# Patient Record
Sex: Female | Born: 1980 | Race: Asian | Hispanic: No | Marital: Single | Smoking: Never smoker
Health system: Southern US, Community
[De-identification: ages and names within clinical notes are randomized; demographics above are authoritative.]

## PROBLEM LIST (undated history)

## (undated) DIAGNOSIS — Z789 Other specified health status: Secondary | ICD-10-CM

## (undated) HISTORY — DX: Other specified health status: Z78.9

---
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
## 2012-01-15 ENCOUNTER — Emergency Department (HOSPITAL_COMMUNITY): Payer: No Typology Code available for payment source

## 2012-01-15 ENCOUNTER — Encounter (HOSPITAL_COMMUNITY): Payer: Self-pay | Admitting: Adult Health

## 2012-01-15 ENCOUNTER — Emergency Department (HOSPITAL_COMMUNITY)
Admission: EM | Admit: 2012-01-15 | Discharge: 2012-01-15 | Disposition: A | Discharge status: Home / Self Care | Payer: No Typology Code available for payment source | Attending: Emergency Medicine | Admitting: Emergency Medicine

## 2012-01-15 DIAGNOSIS — R079 Chest pain, unspecified: Secondary | ICD-10-CM | POA: Insufficient documentation

## 2012-01-15 DIAGNOSIS — M25519 Pain in unspecified shoulder: Secondary | ICD-10-CM | POA: Insufficient documentation

## 2012-01-15 DIAGNOSIS — M25539 Pain in unspecified wrist: Secondary | ICD-10-CM | POA: Insufficient documentation

## 2012-01-15 DIAGNOSIS — T1490XA Injury, unspecified, initial encounter: Secondary | ICD-10-CM | POA: Insufficient documentation

## 2012-01-15 DIAGNOSIS — R51 Headache: Secondary | ICD-10-CM | POA: Insufficient documentation

## 2012-01-15 DIAGNOSIS — R209 Unspecified disturbances of skin sensation: Secondary | ICD-10-CM | POA: Insufficient documentation

## 2012-01-15 DIAGNOSIS — M542 Cervicalgia: Secondary | ICD-10-CM | POA: Insufficient documentation

## 2012-01-15 DIAGNOSIS — M549 Dorsalgia, unspecified: Secondary | ICD-10-CM | POA: Insufficient documentation

## 2012-01-15 DIAGNOSIS — R220 Localized swelling, mass and lump, head: Secondary | ICD-10-CM | POA: Insufficient documentation

## 2012-01-15 MED ORDER — CYCLOBENZAPRINE HCL 5 MG PO TABS: 5.0000 mg | ORAL_TABLET | Freq: Three times a day (TID) | ORAL | Status: AC | PRN

## 2012-01-15 MED ORDER — FLUORESCEIN SODIUM 1 MG OP STRP
1.0000 | ORAL_STRIP | Freq: Once | OPHTHALMIC | Status: DC
  Filled 2012-01-15: qty 1

## 2012-01-15 MED ORDER — IBUPROFEN 600 MG PO TABS: 600.0000 mg | ORAL_TABLET | Freq: Four times a day (QID) | ORAL | Status: AC | PRN

## 2012-01-15 MED ORDER — PROPARACAINE HCL 0.5 % OP SOLN
1.0000 [drp] | Freq: Once | OPHTHALMIC | Status: DC
  Filled 2012-01-15: qty 15

## 2012-01-15 MED ORDER — IBUPROFEN 800 MG PO TABS
800.0000 mg | ORAL_TABLET | Freq: Once | ORAL | Status: AC
  Administered 2012-01-15: 800 mg via ORAL
  Filled 2012-01-15: qty 1
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
## 2012-01-15 NOTE — ED Notes (Signed)
Patient is resting comfortably. 

## 2012-01-15 NOTE — ED Notes (Signed)
Family at bedside. 

## 2012-01-15 NOTE — ED Provider Notes (Signed)
Medical screening examination/treatment/procedure(s) were performed by non-physician practitioner and as supervising physician I was immediately available for consultation/collaboration.  Ethelda Chick, MD 01/15/12 1326

## 2012-10-01 ENCOUNTER — Other Ambulatory Visit (HOSPITAL_COMMUNITY)
Admission: RE | Admit: 2012-10-01 | Discharge: 2012-10-01 | Disposition: A | Discharge status: Home / Self Care | Payer: PRIVATE HEALTH INSURANCE | Source: Ambulatory Visit | Attending: Family Medicine | Admitting: Family Medicine

## 2012-10-01 DIAGNOSIS — Z0000 Encounter for general adult medical examination without abnormal findings: Secondary | ICD-10-CM | POA: Insufficient documentation

## 2012-10-01 DIAGNOSIS — Z113 Encounter for screening for infections with a predominantly sexual mode of transmission: Secondary | ICD-10-CM | POA: Insufficient documentation

## 2015-05-25 ENCOUNTER — Other Ambulatory Visit (HOSPITAL_COMMUNITY)
Admission: RE | Admit: 2015-05-25 | Discharge: 2015-05-25 | Disposition: A | Discharge status: Home / Self Care | Payer: BLUE CROSS/BLUE SHIELD | Source: Ambulatory Visit | Attending: Nurse Practitioner | Admitting: Nurse Practitioner

## 2015-05-25 DIAGNOSIS — Z1151 Encounter for screening for human papillomavirus (HPV): Secondary | ICD-10-CM | POA: Insufficient documentation

## 2015-05-25 DIAGNOSIS — Z01419 Encounter for gynecological examination (general) (routine) without abnormal findings: Secondary | ICD-10-CM | POA: Diagnosis not present

## 2015-05-25 DIAGNOSIS — Z113 Encounter for screening for infections with a predominantly sexual mode of transmission: Secondary | ICD-10-CM | POA: Insufficient documentation

## 2015-06-27 ENCOUNTER — Other Ambulatory Visit: Payer: Self-pay | Admitting: Nurse Practitioner

## 2015-06-27 DIAGNOSIS — R1084 Generalized abdominal pain: Secondary | ICD-10-CM

## 2015-06-29 ENCOUNTER — Other Ambulatory Visit: Payer: PRIVATE HEALTH INSURANCE

## 2015-07-04 ENCOUNTER — Other Ambulatory Visit (HOSPITAL_BASED_OUTPATIENT_CLINIC_OR_DEPARTMENT_OTHER): Payer: Self-pay | Admitting: Family Medicine

## 2015-07-04 DIAGNOSIS — R109 Unspecified abdominal pain: Secondary | ICD-10-CM

## 2015-07-04 DIAGNOSIS — R1032 Left lower quadrant pain: Secondary | ICD-10-CM

## 2015-07-06 ENCOUNTER — Encounter (HOSPITAL_BASED_OUTPATIENT_CLINIC_OR_DEPARTMENT_OTHER): Payer: Self-pay

## 2015-07-06 ENCOUNTER — Ambulatory Visit (HOSPITAL_BASED_OUTPATIENT_CLINIC_OR_DEPARTMENT_OTHER)
Admission: RE | Admit: 2015-07-06 | Discharge: 2015-07-06 | Disposition: A | Discharge status: Home / Self Care | Payer: BLUE CROSS/BLUE SHIELD | Source: Ambulatory Visit | Attending: Family Medicine | Admitting: Family Medicine

## 2015-07-06 DIAGNOSIS — R109 Unspecified abdominal pain: Secondary | ICD-10-CM | POA: Diagnosis present

## 2015-07-06 DIAGNOSIS — N8320 Unspecified ovarian cysts: Secondary | ICD-10-CM | POA: Insufficient documentation

## 2015-07-06 DIAGNOSIS — N750 Cyst of Bartholin's gland: Secondary | ICD-10-CM | POA: Insufficient documentation

## 2015-07-06 DIAGNOSIS — R1032 Left lower quadrant pain: Secondary | ICD-10-CM

## 2015-07-06 MED ORDER — IOHEXOL 300 MG/ML  SOLN
100.0000 mL | Freq: Once | INTRAMUSCULAR | Status: AC | PRN
  Administered 2015-07-06: 100 mL via INTRAVENOUS

## 2016-01-18 ENCOUNTER — Encounter: Payer: Self-pay | Admitting: Family Medicine

## 2016-01-18 ENCOUNTER — Ambulatory Visit (INDEPENDENT_AMBULATORY_CARE_PROVIDER_SITE_OTHER): Payer: BLUE CROSS/BLUE SHIELD | Admitting: Family Medicine

## 2016-01-18 VITALS — BP 145/89 | HR 98 | Ht 62.0 in | Wt 127.0 lb

## 2016-01-18 DIAGNOSIS — N750 Cyst of Bartholin's gland: Secondary | ICD-10-CM | POA: Diagnosis not present

## 2016-01-18 MED ORDER — SULFAMETHOXAZOLE-TRIMETHOPRIM 800-160 MG PO TABS: 1.0000 | ORAL_TABLET | Freq: Two times a day (BID) | ORAL | Status: DC

## 2016-01-18 NOTE — Patient Instructions (Signed)
Bartholin Cyst or Abscess A Bartholin cyst is a fluid-filled sac that forms on a Bartholin gland. Bartholin glands are small glands that are located within the folds of skin (labia) along the sides of the lower opening of the vagina. These glands produce a fluid to moisten the outside of the vagina during sexual intercourse. A Bartholin cyst causes a bulge on the side of the vagina. A cyst that is not large or infected may not cause symptoms or problems. However, if the fluid within the cyst becomes infected, the cyst can turn into an abscess. An abscess may cause discomfort or pain. CAUSES A Bartholin cyst may develop when the duct of the gland becomes blocked. In many cases, the cause of this is not known. Various kinds of bacteria can cause the cyst to become infected and develop into an abscess. RISK FACTORS You may be at an increased risk of developing a Bartholin cyst or abscess if:  You are a woman of reproductive age.  You have a history of previous Bartholin cysts or abscesses.  You have diabetes.  You have a sexually transmitted disease (STD). SIGNS AND SYMPTOMS The severity of symptoms varies depending on the size of the cyst and whether it is infected. Symptoms may include:  A bulge or swelling near the lower opening of your vagina.  Discomfort or pain.  Redness.  Pain during sexual intercourse.  Pain when walking.  Fluid draining from the area. DIAGNOSIS Your health care provider may make a diagnosis based on your symptoms and a physical exam. He or she will look for swelling in your vaginal area. Blood tests may be done to check for infections. A sample of fluid from the cyst or abscess may also be taken to be tested in a lab. TREATMENT Small cysts that are not infected may not require any treatment. These often go away on their own. Yourhealth care provider will recommend hot baths and the use of warm compresses. These may also be part of the treatment for an abscess.  Treatment options for a large cyst or abscess may include:   Antibiotic medicine.  A surgical procedure to drain the abscess. One of the following procedures may be done:  Incision and drainage. An incision is made in the cyst or abscess so that the fluid drains out. A catheter may be placed inside the cyst so that it does not close and fill up with fluid again. The catheter will be removed after you have a follow-up visit with a specialist (gynecologist).  Marsupialization. The cyst or abscess is opened and kept open by stitching the edges of the skin to the walls of the cyst or abscess. This allows it to continue to drain and not fill up with fluid again. If you have cysts or abscesses that keep returning and have required incision and drainage multiple times, your health care provider may talk to you about surgery to remove the Bartholin gland. HOME CARE INSTRUCTIONS  Take medicines only as directed by your health care provider.  If you were prescribed an antibiotic medicine, finish it all even if you start to feel better.  Apply warm, wet compresses to the area or take warm, shallow baths that cover your pelvic region (sitz baths) several times a day or as directed by your health care provider.  Do not squeeze the cyst or apply heavy pressure to it.  Do not have sexual intercourse until the cyst has gone away.  If your cyst or abscess was   opened, a small piece of gauze or a drain may have been placed in the area to allow drainage. Do not remove the gauze or the drain until directed by your health care provider.  Wear feminine pads--not tampons--as needed for any drainage or bleeding.  Keep all follow-up visits as directed by your health care provider. This is important. PREVENTION Take these steps to help prevent a Bartholin cyst from returning:  Practice good hygiene.   Clean your vaginal area with mild soap and a soft cloth when you bathe.  Practice safe sex to prevent  STDs. SEEK MEDICAL CARE IF:  You have increased pain, swelling, or redness in the area of the cyst.  Puslike drainage is coming from the cyst.  You have a fever.   This information is not intended to replace advice given to you by your health care provider. Make sure you discuss any questions you have with your health care provider.   Document Released: 11/04/2005 Document Revised: 11/25/2014 Document Reviewed: 06/20/2014 Elsevier Interactive Patient Education 2016 [employer]  

## 2016-01-18 NOTE — Progress Notes (Addendum)
Patient seen for bartholin Cyst that start about 6 days ago and has become worse.  This is her third cyst in the past year. The last two were last summer.  She has had a Ward catheter placed the first time and had it packed the second time.  The packing fell out within three days, but didn't have reoccurence for until now.  I discussed excision with her, but she is unable to have the surgery done at this time.   Bartholin Cyst I&D  Enlarged abscess palpated in front of the hymenal ring around 5 o' clock.  Written informed consent was obtained.  Discussed complications and possible outcomes of procedure including recurrence of cyst, scarring leading to infecton, bleeding, dyspareunia, distortion of anatomy.  Patient was examined in the dorsal lithotomy position and mass was identified.  The area was prepped with Iodine and draped in a sterile manner. 1% Lidocaine (3 ml) was then used to infiltrate area on top of the cyst, behind the hymenal ring.  A 7 mm incision was made using a sterile scapel. Upon palpation of the mass, a moderate amount of bloody purulent drainage was expressed through the incision. A hemostat was used to break up loculations, which resulted in expression of more bloody purulent drainage.  5cm of iodoform packing was placed in the abscess.  Patient tolerated the procedure well, reported feeling " a lot better."  The packing should be removed within a few days - Bactrim DS bid x 7 days for treatment - Recommended Sitz baths bid and Motrin and Percocet was given  prn pain.   She was told to call to be examined if she experiences increasing swelling, pain, vaginal discharge, or fever.  - She was instructed to wear a peripad to absorb discharge, and to maintain pelvic rest.  - She will need an appointment in [facility] Clinic in 2 weeks.

## 2016-02-01 ENCOUNTER — Encounter: Payer: Self-pay | Admitting: Obstetrics & Gynecology

## 2016-02-01 ENCOUNTER — Encounter (HOSPITAL_COMMUNITY): Payer: Self-pay | Admitting: *Deleted

## 2016-02-01 ENCOUNTER — Ambulatory Visit (INDEPENDENT_AMBULATORY_CARE_PROVIDER_SITE_OTHER): Payer: BLUE CROSS/BLUE SHIELD | Admitting: Obstetrics & Gynecology

## 2016-02-01 VITALS — BP 135/84 | HR 76 | Ht 62.0 in | Wt 127.0 lb

## 2016-02-01 DIAGNOSIS — N750 Cyst of Bartholin's gland: Secondary | ICD-10-CM

## 2016-02-01 DIAGNOSIS — N83291 Other ovarian cyst, right side: Secondary | ICD-10-CM

## 2016-02-01 NOTE — Progress Notes (Signed)
Patient ID: Sherry Sosa, female   DOB: 05/30/1981, 35 y.o.   MRN: [id number] History:  35 y.o. G0P0000 here today for eval of Bartholins gland cyst. 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  ~1 week prev she had it lanced and packed but, the packing fell out that evening.  She declined a Word cath at that time because she'd had one prev and developed 'a UTI and there was a lot of blood in the toilet.'   She reports that she is in a lot of pain today and wants the cyst opened then removed in May. 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  She also reports having a cyst on her ovary that was noted on an MRI.  It was initially 2.5cm and [facility] it had decreased to 2.0cm.  She is [facility].  She denies abd or pelvic pain.  The following portions of the patient's history were reviewed and updated as appropriate: allergies, current medications, past family history, past medical history, past social history, past surgical history and problem list.  Review of Systems:  Pertinent items are noted in HPI.  Objective:  Physical Exam Blood pressure 135/84, pulse 76, height 5\' 2"  (1.575 m), weight 127 lb (57.607 kg), last menstrual period 01/28/2016. Gen: NAD Pelvic: There is non pointing Bartholin's swelling on her left side.  There is swelling with min induration and fluctuance. The actual gland is about 3x3 cm. Soft but tender. This is NOT at a point where it can be lanced safely and effectively.    Labs and Imaging 07/06/2015 CLINICAL DATA: Abdominal pain. Left flank pain. Suprapubic pain.  EXAM: CT ABDOMEN AND PELVIS WITH CONTRAST  TECHNIQUE: Multidetector CT imaging of the abdomen and pelvis was performed using the standard protocol following bolus administration of intravenous  contrast.  CONTRAST: 100mL OMNIPAQUE IOHEXOL 300 MG/ML SOLN  COMPARISON: Pelvic and abdominal ultrasounds dated 06/29/2015  FINDINGS: Lower chest: Negative.  Hepatobiliary: Normal.  Pancreas: Normal.  Spleen: Normal.  Adrenals/Urinary Tract: Normal.  Stomach/Bowel: Normal including the terminal ileum and appendix. The cecum lies in the midline of the pelvis just above the bladder.  Vascular/Lymphatic: Normal.  Reproductive: 2.5 cm cyst on the right ovary. Uterus and left ovary are normal. 15 mm Bartholin's gland cyst in the left labia.  Other: No free air or free fluid.  Musculoskeletal: Normal.  IMPRESSION: 2.5 cm cyst on the right ovary. Left Bartholin's gland cyst. Otherwise benign appearing abdomen and pelvis.  Assessment & Plan:  Right ov cyst- asymptomatic and probably benign.   [facility] to review sono from last month.  Bartholin's cyst-  Have discussed with pt in detail when these lesions can be lanced vs treated conservatively.  Since this is her 4th episode I rec cystectomy vs Marsupialization. Pt wants a Bartholin's gland removal but, is currently constrained by work.  She would like to schedule in May If lesion flares before procedure pt needs to f/u if it is on the weekend she may go to the [facility] if the pain is severe Warm compress to gland 4-5x/day   Patient desires surgical management with Bartholin's gland cystectomy.  The risks of surgery were discussed in detail with the patient including but not limited to: bleeding  which may require transfusion or reoperation; infection which may require prolonged hospitalization or re-hospitalization and antibiotic therapy; injury to bowel, bladder, ureters and major vessels or other surrounding organs; need for additional procedures including laparotomy; thromboembolic phenomenon, incisional problems and other postoperative or anesthesia complications.  Patient was told that the  likelihood that her condition and symptoms will be treated effectively with this surgical management was very high; the postoperative expectations were also discussed in detail. The patient also understands the alternative treatment options which were discussed in full. All questions were answered.  She was told that she will be contacted by our surgical scheduler regarding the time and date of her surgery; routine preoperative instructions of having nothing to eat or drink after midnight on the day prior to surgery and also coming to the hospital 1 1/2 hours prior to her time of surgery were also emphasized.  She was told she may be called for a preoperative appointment about a week prior to surgery and will be given further preoperative instructions at that visit. Printed patient education handouts about the procedure were given to the patient to review at home.

## 2016-02-01 NOTE — Patient Instructions (Signed)
Bartholin Cyst or Abscess A Bartholin cyst is a fluid-filled sac that forms on a Bartholin gland. Bartholin glands are small glands that are located within the folds of skin (labia) along the sides of the lower opening of the vagina. These glands produce a fluid to moisten the outside of the vagina during sexual intercourse. A Bartholin cyst causes a bulge on the side of the vagina. A cyst that is not large or infected may not cause symptoms or problems. However, if the fluid within the cyst becomes infected, the cyst can turn into an abscess. An abscess may cause discomfort or pain. CAUSES A Bartholin cyst may develop when the duct of the gland becomes blocked. In many cases, the cause of this is not known. Various kinds of bacteria can cause the cyst to become infected and develop into an abscess. RISK FACTORS You may be at an increased risk of developing a Bartholin cyst or abscess if:  You are a woman of reproductive age.  You have a history of previous Bartholin cysts or abscesses.  You have diabetes.  You have a sexually transmitted disease (STD). SIGNS AND SYMPTOMS The severity of symptoms varies depending on the size of the cyst and whether it is infected. Symptoms may include:  A bulge or swelling near the lower opening of your vagina.  Discomfort or pain.  Redness.  Pain during sexual intercourse.  Pain when walking.  Fluid draining from the area. DIAGNOSIS Your health care provider may make a diagnosis based on your symptoms and a physical exam. He or she will look for swelling in your vaginal area. Blood tests may be done to check for infections. A sample of fluid from the cyst or abscess may also be taken to be tested in a lab. TREATMENT Small cysts that are not infected may not require any treatment. These often go away on their own. Yourhealth care provider will recommend hot baths and the use of warm compresses. These may also be part of the treatment for an abscess.  Treatment options for a large cyst or abscess may include:   Antibiotic medicine.  A surgical procedure to drain the abscess. One of the following procedures may be done:  Incision and drainage. An incision is made in the cyst or abscess so that the fluid drains out. A catheter may be placed inside the cyst so that it does not close and fill up with fluid again. The catheter will be removed after you have a follow-up visit with a specialist (gynecologist).  Marsupialization. The cyst or abscess is opened and kept open by stitching the edges of the skin to the walls of the cyst or abscess. This allows it to continue to drain and not fill up with fluid again. If you have cysts or abscesses that keep returning and have required incision and drainage multiple times, your health care provider may talk to you about surgery to remove the Bartholin gland. HOME CARE INSTRUCTIONS  Take medicines only as directed by your health care provider.  If you were prescribed an antibiotic medicine, finish it all even if you start to feel better.  Apply warm, wet compresses to the area or take warm, shallow baths that cover your pelvic region (sitz baths) several times a day or as directed by your health care provider.  Do not squeeze the cyst or apply heavy pressure to it.  Do not have sexual intercourse until the cyst has gone away.  If your cyst or abscess was   opened, a small piece of gauze or a drain may have been placed in the area to allow drainage. Do not remove the gauze or the drain until directed by your health care provider.  Wear feminine pads--not tampons--as needed for any drainage or bleeding.  Keep all follow-up visits as directed by your health care provider. This is important. PREVENTION Take these steps to help prevent a Bartholin cyst from returning:  Practice good hygiene.   Clean your vaginal area with mild soap and a soft cloth when you bathe.  Practice safe sex to prevent  STDs. SEEK MEDICAL CARE IF:  You have increased pain, swelling, or redness in the area of the cyst.  Puslike drainage is coming from the cyst.  You have a fever.   This information is not intended to replace advice given to you by your health care provider. Make sure you discuss any questions you have with your health care provider.   Document Released: 11/04/2005 Document Revised: 11/25/2014 Document Reviewed: 06/20/2014 Elsevier Interactive Patient Education 2016 [employer]  

## 2016-02-11 ENCOUNTER — Other Ambulatory Visit: Payer: Self-pay | Admitting: Family Medicine

## 2016-02-19 ENCOUNTER — Ambulatory Visit (INDEPENDENT_AMBULATORY_CARE_PROVIDER_SITE_OTHER): Payer: BLUE CROSS/BLUE SHIELD | Admitting: Obstetrics & Gynecology

## 2016-02-19 ENCOUNTER — Encounter: Payer: Self-pay | Admitting: Obstetrics & Gynecology

## 2016-02-19 VITALS — BP 128/79 | HR 85 | Ht 62.0 in | Wt 128.0 lb

## 2016-02-19 DIAGNOSIS — N751 Abscess of Bartholin's gland: Secondary | ICD-10-CM | POA: Diagnosis not present

## 2016-02-19 MED ORDER — SULFAMETHOXAZOLE-TRIMETHOPRIM 800-160 MG PO TABS: 1.0000 | ORAL_TABLET | Freq: Two times a day (BID) | ORAL | Status: DC

## 2016-02-19 MED ORDER — TRAMADOL HCL 50 MG PO TABS: 50.0000 mg | ORAL_TABLET | Freq: Four times a day (QID) | ORAL | Status: DC | PRN

## 2016-02-19 NOTE — Progress Notes (Signed)
Patient ID: Sherry FraiseYen Thi Mardene SayerHong Sosa, female   DOB: 1981/01/16, 35 y.o.   MRN: [id number] Bartholin Cyst I&D and Word Catheter Placement Enlarged abscess palpated in front of the hymenal ring around 5 o' clock.  Written informed consent was obtained.  Discussed complications and possible outcomes of procedure including recurrence of cyst, scarring leading to infection, bleeding, dyspareunia, distortion of anatomy.  Patient was examined in the dorsal lithotomy position and mass was identified.  The area was prepped with Iodine and draped in a sterile manner. 1% Lidocaine (3 ml) was then used to infiltrate area on top of the cyst.  A 5 mm incision was made using a sterile scapel. Upon palpation of the mass, a moderate amount of bloody purulent drainage was expressed through the incision. A Word catheter was placed. 1.5 ml of sterile water was used to inflate the catheter balloon.  The end of the catheter was tucked into the vagina.  Patient tolerated the procedure well, reported feeling " a lot better." - Bactrim DS bid x 7 days for treatment - Recommended Sitz baths bid and Naproxen was given prn pain.   She was told to call to be examined if she experiences increasing swelling, pain, vaginal discharge, or fever.  - She was instructed to wear a peripad to absorb discharge, and to maintain pelvic rest while the Word catheter is in place.  -The catheter will be left in place for at least two to promote formation of an epithelialized tract.  Pt wishes to have gland excised as this is a recurrent problem.  She is scheduled for excision in May but now wants it ASAP. Will move case to April 11.  Divya Munshi L. Harraway-Smith, M.D., Evern CoreFACOG

## 2016-02-19 NOTE — Patient Instructions (Signed)
Bartholin Cyst or Abscess A Bartholin cyst is a fluid-filled sac that forms on a Bartholin gland. Bartholin glands are small glands that are located within the folds of skin (labia) along the sides of the lower opening of the vagina. These glands produce a fluid to moisten the outside of the vagina during sexual intercourse. A Bartholin cyst causes a bulge on the side of the vagina. A cyst that is not large or infected may not cause symptoms or problems. However, if the fluid within the cyst becomes infected, the cyst can turn into an abscess. An abscess may cause discomfort or pain. CAUSES A Bartholin cyst may develop when the duct of the gland becomes blocked. In many cases, the cause of this is not known. Various kinds of bacteria can cause the cyst to become infected and develop into an abscess. RISK FACTORS You may be at an increased risk of developing a Bartholin cyst or abscess if:  You are a woman of reproductive age.  You have a history of previous Bartholin cysts or abscesses.  You have diabetes.  You have a sexually transmitted disease (STD). SIGNS AND SYMPTOMS The severity of symptoms varies depending on the size of the cyst and whether it is infected. Symptoms may include:  A bulge or swelling near the lower opening of your vagina.  Discomfort or pain.  Redness.  Pain during sexual intercourse.  Pain when walking.  Fluid draining from the area. DIAGNOSIS Your health care provider may make a diagnosis based on your symptoms and a physical exam. He or she will look for swelling in your vaginal area. Blood tests may be done to check for infections. A sample of fluid from the cyst or abscess may also be taken to be tested in a lab. TREATMENT Small cysts that are not infected may not require any treatment. These often go away on their own. Yourhealth care provider will recommend hot baths and the use of warm compresses. These may also be part of the treatment for an abscess.  Treatment options for a large cyst or abscess may include:   Antibiotic medicine.  A surgical procedure to drain the abscess. One of the following procedures may be done:  Incision and drainage. An incision is made in the cyst or abscess so that the fluid drains out. A catheter may be placed inside the cyst so that it does not close and fill up with fluid again. The catheter will be removed after you have a follow-up visit with a specialist (gynecologist).  Marsupialization. The cyst or abscess is opened and kept open by stitching the edges of the skin to the walls of the cyst or abscess. This allows it to continue to drain and not fill up with fluid again. If you have cysts or abscesses that keep returning and have required incision and drainage multiple times, your health care provider may talk to you about surgery to remove the Bartholin gland. HOME CARE INSTRUCTIONS  Take medicines only as directed by your health care provider.  If you were prescribed an antibiotic medicine, finish it all even if you start to feel better.  Apply warm, wet compresses to the area or take warm, shallow baths that cover your pelvic region (sitz baths) several times a day or as directed by your health care provider.  Do not squeeze the cyst or apply heavy pressure to it.  Do not have sexual intercourse until the cyst has gone away.  If your cyst or abscess was   opened, a small piece of gauze or a drain may have been placed in the area to allow drainage. Do not remove the gauze or the drain until directed by your health care provider.  Wear feminine pads--not tampons--as needed for any drainage or bleeding.  Keep all follow-up visits as directed by your health care provider. This is important. PREVENTION Take these steps to help prevent a Bartholin cyst from returning:  Practice good hygiene.   Clean your vaginal area with mild soap and a soft cloth when you bathe.  Practice safe sex to prevent  STDs. SEEK MEDICAL CARE IF:  You have increased pain, swelling, or redness in the area of the cyst.  Puslike drainage is coming from the cyst.  You have a fever.   This information is not intended to replace advice given to you by your health care provider. Make sure you discuss any questions you have with your health care provider.   Document Released: 11/04/2005 Document Revised: 11/25/2014 Document Reviewed: 06/20/2014 Elsevier Interactive Patient Education 2016 [employer]  

## 2016-02-23 ENCOUNTER — Encounter (HOSPITAL_COMMUNITY): Payer: Self-pay

## 2016-02-23 ENCOUNTER — Encounter (HOSPITAL_COMMUNITY)
Admission: RE | Admit: 2016-02-23 | Discharge: 2016-02-23 | Disposition: A | Discharge status: Home / Self Care | Payer: BLUE CROSS/BLUE SHIELD | Source: Ambulatory Visit | Attending: Obstetrics & Gynecology | Admitting: Obstetrics & Gynecology

## 2016-02-23 DIAGNOSIS — Z01812 Encounter for preprocedural laboratory examination: Secondary | ICD-10-CM | POA: Diagnosis not present

## 2016-02-23 DIAGNOSIS — N751 Abscess of Bartholin's gland: Secondary | ICD-10-CM | POA: Insufficient documentation

## 2016-02-23 LAB — CBC
HCT: 38.5 % (ref 36.0–46.0)
Hemoglobin: 13 g/dL (ref 12.0–15.0)
MCH: 30.9 pg (ref 26.0–34.0)
MCHC: 33.8 g/dL (ref 30.0–36.0)
MCV: 91.4 fL (ref 78.0–100.0)
PLATELETS: 360 10*3/uL (ref 150–400)
RBC: 4.21 MIL/uL (ref 3.87–5.11)
RDW: 12.5 % (ref 11.5–15.5)
WBC: 7.6 10*3/uL (ref 4.0–10.5)

## 2016-02-23 NOTE — Patient Instructions (Signed)
   Your procedure is scheduled on: April 11 (TUESDAY)  Enter through the Main Entrance of [facility] at: 1PM  Pick up the phone at the desk and dial [phone] and inform us of your arrival.  Please call this number if you have any problems the morning of surgery: [phone]  Remember: Do not eat food after midnight: April 10 (MONDAY) Do not drink clear liquids after: 10AM DAY OF SURGERY  Take these medicines the morning of surgery with a SIP OF WATER:  Do not wear jewelry, make-up,  No metal in your hair or on your body. Do not wear lotions, powders, perfumes.  You may wear deodorant.  Do not bring valuables to the hospital. Contacts, dentures or bridgework may not be worn into surgery.    Patients discharged on the day of surgery will not be allowed to drive home.

## 2016-02-27 ENCOUNTER — Ambulatory Visit (HOSPITAL_COMMUNITY): Payer: BLUE CROSS/BLUE SHIELD | Admitting: Anesthesiology

## 2016-02-27 ENCOUNTER — Observation Stay (HOSPITAL_COMMUNITY)
Admission: RE | Admit: 2016-02-27 | Discharge: 2016-02-28 | Disposition: A | Discharge status: Home / Self Care | Payer: BLUE CROSS/BLUE SHIELD | Source: Ambulatory Visit | Attending: Family Medicine | Admitting: Family Medicine

## 2016-02-27 ENCOUNTER — Encounter (HOSPITAL_COMMUNITY)
Admission: RE | Disposition: A | Discharge status: Home / Self Care | Payer: Self-pay | Source: Ambulatory Visit | Attending: Family Medicine

## 2016-02-27 ENCOUNTER — Encounter (HOSPITAL_COMMUNITY): Payer: Self-pay | Admitting: *Deleted

## 2016-02-27 DIAGNOSIS — Z886 Allergy status to analgesic agent status: Secondary | ICD-10-CM | POA: Diagnosis not present

## 2016-02-27 DIAGNOSIS — Z91040 Latex allergy status: Secondary | ICD-10-CM | POA: Diagnosis not present

## 2016-02-27 DIAGNOSIS — N751 Abscess of Bartholin's gland: Secondary | ICD-10-CM

## 2016-02-27 DIAGNOSIS — Z91013 Allergy to seafood: Secondary | ICD-10-CM | POA: Diagnosis not present

## 2016-02-27 DIAGNOSIS — Z9889 Other specified postprocedural states: Secondary | ICD-10-CM

## 2016-02-27 DIAGNOSIS — N750 Cyst of Bartholin's gland: Secondary | ICD-10-CM | POA: Diagnosis not present

## 2016-02-27 HISTORY — PX: BARTHOLIN CYST MARSUPIALIZATION: SHX5383

## 2016-02-27 LAB — BASIC METABOLIC PANEL
ANION GAP: 6 (ref 5–15)
BUN: 8 mg/dL (ref 6–20)
CHLORIDE: 98 mmol/L — AB (ref 101–111)
CO2: 26 mmol/L (ref 22–32)
Calcium: 8.5 mg/dL — ABNORMAL LOW (ref 8.9–10.3)
Creatinine, Ser: 0.69 mg/dL (ref 0.44–1.00)
GFR calc Af Amer: >60 mL/min (ref >60)
GFR calc non Af Amer: >60 mL/min (ref >60)
Glucose, Bld: 133 mg/dL — ABNORMAL HIGH (ref 65–99)
POTASSIUM: 3.8 mmol/L (ref 3.5–5.1)
SODIUM: 130 mmol/L — AB (ref 135–145)

## 2016-02-27 LAB — PREGNANCY, URINE: Preg Test, Ur: NEGATIVE

## 2016-02-27 SURGERY — MARSUPIALIZATION, CYST, BARTHOLIN'S GLAND
Anesthesia: Monitor Anesthesia Care | Site: Vagina

## 2016-02-27 MED ORDER — SULFAMETHOXAZOLE-TRIMETHOPRIM 800-160 MG PO TABS: 1.0000 | ORAL_TABLET | Freq: Two times a day (BID) | ORAL | Status: DC

## 2016-02-27 MED ORDER — TRAMADOL HCL 50 MG PO TABS
50.0000 mg | ORAL_TABLET | Freq: Four times a day (QID) | ORAL | Status: DC
Start: 2016-02-27 — End: 2016-02-28
  Administered 2016-02-27 – 2016-02-28 (×2): 50 mg via ORAL
  Filled 2016-02-27 (×2): qty 1

## 2016-02-27 MED ORDER — OXYCODONE HCL 5 MG PO TABS
5.0000 mg | ORAL_TABLET | Freq: Once | ORAL | Status: AC | PRN
  Administered 2016-02-27: 5 mg via ORAL

## 2016-02-27 MED ORDER — FENTANYL CITRATE (PF) 100 MCG/2ML IJ SOLN
25.0000 ug | INTRAMUSCULAR | Status: DC | PRN
  Administered 2016-02-27 (×2): 50 ug via INTRAVENOUS

## 2016-02-27 MED ORDER — OXYCODONE-ACETAMINOPHEN 5-325 MG PO TABS
1.0000 | ORAL_TABLET | ORAL | Status: DC | PRN
  Administered 2016-02-27: 2 via ORAL
  Filled 2016-02-27: qty 2

## 2016-02-27 MED ORDER — SCOPOLAMINE 1 MG/3DAYS TD PT72
1.0000 | MEDICATED_PATCH | Freq: Once | TRANSDERMAL | Status: DC
  Administered 2016-02-27: 1.5 mg via TRANSDERMAL

## 2016-02-27 MED ORDER — FENTANYL CITRATE (PF) 250 MCG/5ML IJ SOLN
INTRAMUSCULAR | Status: DC | PRN
  Administered 2016-02-27: 100 ug via INTRAVENOUS

## 2016-02-27 MED ORDER — LACTATED RINGERS IV SOLN: INTRAVENOUS | Status: DC

## 2016-02-27 MED ORDER — LIDOCAINE HCL (CARDIAC) 20 MG/ML IV SOLN
INTRAVENOUS | Status: DC | PRN
  Administered 2016-02-27: 80 mg via INTRAVENOUS

## 2016-02-27 MED ORDER — FENTANYL CITRATE (PF) 100 MCG/2ML IJ SOLN
INTRAMUSCULAR | Status: AC
  Filled 2016-02-27: qty 2

## 2016-02-27 MED ORDER — LACTATED RINGERS IV BOLUS (SEPSIS)
500.0000 mL | Freq: Once | INTRAVENOUS | Status: AC
  Administered 2016-02-27: 500 mL via INTRAVENOUS

## 2016-02-27 MED ORDER — SODIUM CHLORIDE 0.9% FLUSH
INTRAVENOUS | Status: AC
  Filled 2016-02-27: qty 3

## 2016-02-27 MED ORDER — LACTATED RINGERS IV SOLN
INTRAVENOUS | Status: DC
  Administered 2016-02-27 (×2): via INTRAVENOUS

## 2016-02-27 MED ORDER — ONDANSETRON HCL 4 MG/2ML IJ SOLN: 4.0000 mg | Freq: Once | INTRAMUSCULAR | Status: DC | PRN

## 2016-02-27 MED ORDER — ONDANSETRON HCL 4 MG/2ML IJ SOLN
INTRAMUSCULAR | Status: AC
  Filled 2016-02-27: qty 2

## 2016-02-27 MED ORDER — SCOPOLAMINE 1 MG/3DAYS TD PT72
MEDICATED_PATCH | TRANSDERMAL | Status: AC
  Administered 2016-02-27: 1.5 mg via TRANSDERMAL
  Filled 2016-02-27: qty 1

## 2016-02-27 MED ORDER — MIDAZOLAM HCL 2 MG/2ML IJ SOLN
INTRAMUSCULAR | Status: AC
  Filled 2016-02-27: qty 2

## 2016-02-27 MED ORDER — FENTANYL CITRATE (PF) 250 MCG/5ML IJ SOLN
INTRAMUSCULAR | Status: AC
  Filled 2016-02-27: qty 5

## 2016-02-27 MED ORDER — DEXAMETHASONE SODIUM PHOSPHATE 4 MG/ML IJ SOLN
INTRAMUSCULAR | Status: DC | PRN
  Administered 2016-02-27: 4 mg via INTRAVENOUS

## 2016-02-27 MED ORDER — LIDOCAINE HCL (CARDIAC) 20 MG/ML IV SOLN
INTRAVENOUS | Status: AC
  Filled 2016-02-27: qty 5

## 2016-02-27 MED ORDER — BUPIVACAINE HCL (PF) 0.5 % IJ SOLN
INTRAMUSCULAR | Status: AC
  Filled 2016-02-27: qty 30

## 2016-02-27 MED ORDER — OXYCODONE-ACETAMINOPHEN 5-325 MG PO TABS: 1.0000 | ORAL_TABLET | Freq: Four times a day (QID) | ORAL | Status: DC | PRN

## 2016-02-27 MED ORDER — ZOLPIDEM TARTRATE 5 MG PO TABS
5.0000 mg | ORAL_TABLET | Freq: Every evening | ORAL | Status: DC | PRN
  Administered 2016-02-27: 5 mg via ORAL
  Filled 2016-02-27: qty 1

## 2016-02-27 MED ORDER — MIDAZOLAM HCL 2 MG/2ML IJ SOLN
INTRAMUSCULAR | Status: DC | PRN
  Administered 2016-02-27: 2 mg via INTRAVENOUS

## 2016-02-27 MED ORDER — PROPOFOL 10 MG/ML IV BOLUS
INTRAVENOUS | Status: DC | PRN
  Administered 2016-02-27: 50 mg via INTRAVENOUS
  Administered 2016-02-27: 150 mg via INTRAVENOUS

## 2016-02-27 MED ORDER — BUPIVACAINE HCL (PF) 0.5 % IJ SOLN
INTRAMUSCULAR | Status: DC | PRN
  Administered 2016-02-27: 30 mL

## 2016-02-27 MED ORDER — PROPOFOL 10 MG/ML IV BOLUS
INTRAVENOUS | Status: AC
  Filled 2016-02-27: qty 20

## 2016-02-27 MED ORDER — ONDANSETRON HCL 4 MG/2ML IJ SOLN
INTRAMUSCULAR | Status: DC | PRN
  Administered 2016-02-27: 4 mg via INTRAVENOUS

## 2016-02-27 MED ORDER — DEXAMETHASONE SODIUM PHOSPHATE 10 MG/ML IJ SOLN
INTRAMUSCULAR | Status: AC
  Filled 2016-02-27: qty 1

## 2016-02-27 MED ORDER — OXYCODONE HCL 5 MG PO TABS
ORAL_TABLET | ORAL | Status: AC
  Filled 2016-02-27: qty 1

## 2016-02-27 MED ORDER — OXYCODONE HCL 5 MG/5ML PO SOLN: 5.0000 mg | Freq: Once | ORAL | Status: AC | PRN

## 2016-02-27 SURGICAL SUPPLY — 27 items
BLADE SURG 15 STRL LF C SS BP (BLADE) ×1 IMPLANT
BLADE SURG 15 STRL SS (BLADE) ×1
CLOTH BEACON ORANGE TIMEOUT ST (SAFETY) ×2 IMPLANT
CONTAINER PREFILL 10% NBF 60ML (FORM) IMPLANT
COUNTER NEEDLE 1200 MAGNETIC (NEEDLE) IMPLANT
ELECT NEEDLE TIP 2.8 STRL (NEEDLE) ×2 IMPLANT
ELECT REM PT RETURN 9FT ADLT (ELECTROSURGICAL) ×2
ELECTRODE REM PT RTRN 9FT ADLT (ELECTROSURGICAL) ×1 IMPLANT
GLOVE BIOGEL PI IND STRL 7.0 (GLOVE) ×2 IMPLANT
GLOVE BIOGEL PI INDICATOR 7.0 (GLOVE) ×2
GLOVE ECLIPSE 7.0 STRL STRAW (GLOVE) ×4 IMPLANT
GOWN STRL REUS W/TWL LRG LVL3 (GOWN DISPOSABLE) ×4 IMPLANT
GOWN STRL REUS W/TWL XL LVL3 (GOWN DISPOSABLE) ×2 IMPLANT
HEMOSTAT SURGICEL 4X8 (HEMOSTASIS) ×2 IMPLANT
NEEDLE HYPO 22GX1.5 SAFETY (NEEDLE) IMPLANT
PACK VAGINAL MINOR WOMEN LF (CUSTOM PROCEDURE TRAY) ×2 IMPLANT
PAD PREP 24X48 CUFFED NSTRL (MISCELLANEOUS) ×2 IMPLANT
PENCIL BUTTON HOLSTER BLD 10FT (ELECTRODE) ×2 IMPLANT
SUT VIC AB 2-0 CT1 27 (SUTURE) ×1
SUT VIC AB 2-0 CT1 TAPERPNT 27 (SUTURE) ×1 IMPLANT
SUT VIC AB 3-0 SH 27 (SUTURE) ×1
SUT VIC AB 3-0 SH 27X BRD (SUTURE) ×1 IMPLANT
SYR BULB 3OZ (MISCELLANEOUS) ×4 IMPLANT
TOWEL OR 17X24 6PK STRL BLUE (TOWEL DISPOSABLE) ×4 IMPLANT
TUBING NON-CON 1/4 X 20 CONN (TUBING) ×2 IMPLANT
WATER STERILE IRR 1000ML POUR (IV SOLUTION) ×2 IMPLANT
YANKAUER SUCT BULB TIP NO VENT (SUCTIONS) ×2 IMPLANT

## 2016-02-27 NOTE — Progress Notes (Signed)
Dr. Adrian BlackwaterStinson called. Patient still not able to void after drinking fluids, walking floors and attempting to void in restroom. He is aware patient feels like she has swelling to perineum.

## 2016-02-27 NOTE — Op Note (Addendum)
02/27/2016  4:39 PM  PATIENT:  Sherry Sosa  35 y.o. female  PRE-OPERATIVE DIAGNOSIS:   left bartholin gland cyst  POST-OPERATIVE DIAGNOSIS:   left bartholin gland cyst  PROCEDURE:  Procedure(s): BARTHOLIN CYST RESECTION (N/A)  SURGEON:  Surgeon(s) and Role:    * Willodean Rosenthalarolyn Harraway-Smith, MD - Primary  ANESTHESIA:   general  EBL:  Total I/O In: 1500 [I.V.:1500] Out: 85 [Urine:10; Blood:75]  BLOOD ADMINISTERED:none  DRAINS: none   LOCAL MEDICATIONS USED:  MARCAINE     SPECIMEN:  Source of Specimen:  cyst wall  DISPOSITION OF SPECIMEN:  PATHOLOGY  COUNTS:  YES  TOURNIQUET:  * No tourniquets in log *  DICTATION: .Note written in EPIC  PLAN OF CARE: Discharge to home after PACU  PATIENT DISPOSITION:  PACU - hemodynamically stable.   Delay start of Pharmacological VTE agent (>24hrs) due to surgical blood loss or risk of bleeding: not applicable  Enlarged Bartholin's gland palpated in front of the hymenal ring around 5 or 7 o' clock on left side. Written informed consent was obtained. the patient insisted on a Bartholin's gland resection vs a marsupialization.  Preoperative consent was obtained Patient was placed in the dorsal lithotomy position and prepped and draped in the usual sterile fashion. A Word catheter was in place. This was removed and the site was irrigated with normal saline. The fluid was clear.  0.5% Marcaine was then used to infiltrate area on top of the cyst. An incision was made over the cyst wall with a scalpel and using Alis clamps the cyst wall was dissected off of the vaginal mucosa. The cyst was dissected off of the vaginal mucosa using sharp and blunt dissection (using a sponge to gently separate the cyst wall from the vagina). The collapsed cyst was was dissected out completely. The bed of the cyst space was closed with 3-0 vicryl in a running and locked fashion. Excellent hemostasis was noted. The vaginal mucosa was reapproximated using 3-0  vicryl. The patient tolerated the procedure well. Excellent hemostasis was noted. There were no immediate complications  - Bactrim DS  bid x 7 days for treatment - Sitz baths bid. Percocet was given prn pain. She was told to call to be examined if she experiences increasing swelling, pain, vaginal discharge, or fever. - She was instructed to wear a peripad.  - [facility]  Taquanna Borras L. Harraway-Smith, M.D., Evern CoreFACOG

## 2016-02-27 NOTE — Brief Op Note (Signed)
02/27/2016  4:39 PM  PATIENT:  Sherry Sosa  35 y.o. female  PRE-OPERATIVE DIAGNOSIS:   left bartholin gland cyst  POST-OPERATIVE DIAGNOSIS:   left bartholin gland cyst  PROCEDURE:  Procedure(s): BARTHOLIN CYST RESECTION (N/A)  SURGEON:  Surgeon(s) and Role:    * Willodean Rosenthalarolyn Harraway-Smith, MD - Primary  ANESTHESIA:   general  EBL:  Total I/O In: 1500 [I.V.:1500] Out: 85 [Urine:10; Blood:75]  BLOOD ADMINISTERED:none  DRAINS: none   LOCAL MEDICATIONS USED:  MARCAINE     SPECIMEN:  Source of Specimen:  cyst wall  DISPOSITION OF SPECIMEN:  PATHOLOGY  COUNTS:  YES  TOURNIQUET:  * No tourniquets in log *  DICTATION: .Note written in EPIC  PLAN OF CARE: Discharge to home after PACU  PATIENT DISPOSITION:  PACU - hemodynamically stable.   Delay start of Pharmacological VTE agent (>24hrs) due to surgical blood loss or risk of bleeding: not applicable  Complications: none  Sherry Sosa L. Harraway-Smith, M.D., [facility] CoreFACOG

## 2016-02-27 NOTE — Anesthesia Postprocedure Evaluation (Signed)
Anesthesia Post Note  Patient: [name]  Procedure(s) Performed: Procedure(s) (LRB): BARTHOLIN CYST RESECTION (N/A)  Patient location during evaluation: PACU Anesthesia Type: General Level of consciousness: awake Pain management: pain level controlled Vital Signs Assessment: post-procedure vital signs reviewed and stable Respiratory status: spontaneous breathing Cardiovascular status: stable Postop Assessment: no signs of nausea or vomiting Anesthetic complications: no    Last Vitals:  Filed Vitals:   02/27/16 1700 02/27/16 1715  BP: 149/98 139/86  Pulse: 56 69  Temp:    Resp: 20 19    Last Pain:  Filed Vitals:   02/27/16 1725  PainSc: 5                  [name],JOHN Violett Hobbs

## 2016-02-27 NOTE — Anesthesia Preprocedure Evaluation (Addendum)
Anesthesia Evaluation  Patient identified by MRN, date of birth, ID band Patient awake    Reviewed: Allergy & Precautions, NPO status , Patient's Chart, lab work & pertinent test results  Airway Mallampati: II  TM Distance: >3 FB Neck ROM: Full    Dental  (+) Teeth Intact, Dental Advisory Given   Pulmonary    breath sounds clear to auscultation       Cardiovascular  Rhythm:Regular Rate:Normal     Neuro/Psych    GI/Hepatic   Endo/Other    Renal/GU      Musculoskeletal   Abdominal   Peds  Hematology   Anesthesia Other Findings   Reproductive/Obstetrics                            Anesthesia Physical Anesthesia Plan  ASA: I  Anesthesia Plan: MAC   Post-op Pain Management:    Induction: Intravenous  Airway Management Planned: LMA  Additional Equipment:   Intra-op Plan:   Post-operative Plan:   Informed Consent: I have reviewed the patients History and Physical, chart, labs and discussed the procedure including the risks, benefits and alternatives for the proposed anesthesia with the patient or authorized representative who has indicated his/her understanding and acceptance.   Dental advisory given  Plan Discussed with: CRNA and Anesthesiologist  Anesthesia Plan Comments:         Anesthesia Quick Evaluation

## 2016-02-27 NOTE — H&P (Signed)
Preoperative History and Physical  Sherry ChandlerYen Thi Hong Sosa RollsLe is a 35 y.o. G0P0000 here for surgical management of Bartholin's gland abscess.   Proposed surgery: Bartholin's gland resection possible Bartholin's gland marsupialization  No past medical history on file. No past surgical history on file. OB History    Gravida Para Term Preterm AB TAB SAB Ectopic Multiple Living   0 0 0 0 0 0 0 0 0 0      Patient denies any cervical dysplasia or STIs. Prescriptions prior to admission  Medication Sig Dispense Refill Last Dose  . acetaminophen (TYLENOL) 325 MG tablet Take 325 mg by mouth every 6 (six) hours as needed for moderate pain.    Taking  . cetirizine (ZYRTEC) 10 MG tablet Take 10 mg by mouth daily.     . cycloSPORINE (RESTASIS) 0.05 % ophthalmic emulsion Place 1 drop into both eyes 2 (two) times daily.     . norethindrone-ethinyl estradiol (JUNEL FE,GILDESS FE,LOESTRIN FE) 1-20 MG-MCG tablet Take 1 tablet by mouth daily.     . Olopatadine HCl (PAZEO) 0.7 % SOLN Place 1 drop into both eyes daily.     . ranitidine (ZANTAC) 150 MG tablet Take 150 mg by mouth daily as needed for heartburn.     . sulfamethoxazole-trimethoprim (BACTRIM DS,SEPTRA DS) 800-160 MG tablet Take 1 tablet by mouth 2 (two) times daily. 14 tablet 0   . traMADol (ULTRAM) 50 MG tablet Take 1 tablet (50 mg total) by mouth every 6 (six) hours as needed. (Patient taking differently: Take 50 mg by mouth every 6 (six) hours as needed for moderate pain. ) 30 tablet 0   . sulfamethoxazole-trimethoprim (BACTRIM DS,SEPTRA DS) 800-160 MG tablet Take 1 tablet by mouth 2 (two) times daily. (Patient not taking: Reported on 02/19/2016) 14 tablet 0 Completed Course at Unknown time    Allergies  Allergen Reactions  . Tuna [Fish Allergy] Anaphylaxis  . Advil [Ibuprofen] Swelling  . Latex Hives   Social History:   reports that she has never smoked. She does not have any smokeless tobacco history on file. She reports that she does not drink alcohol  or use illicit drugs. Family History  Problem Relation Age of Onset  . Cancer Neg Hx   . Hypertension Neg Hx   . Stroke Neg Hx     Review of Systems: Noncontributory  PHYSICAL EXAM: Blood pressure 128/77, pulse 70, temperature 98.2 F (36.8 C), resp. rate 20, last menstrual period 01/28/2016, SpO2 100 %. General appearance - alert, well appearing, and in no distress Chest - clear to auscultation, no wheezes, rales or rhonchi, symmetric air entry Heart - normal rate and regular rhythm Abdomen - soft, nontender, nondistended, no masses or organomegaly Pelvic - examination not indicated; word catheter in place Extremities - peripheral pulses normal, no pedal edema, no clubbing or cyanosis  Labs: Results for orders placed or performed during the hospital encounter of 02/27/16 (from the past 336 hour(s))  Pregnancy, urine   Collection Time: 02/27/16 12:30 PM  Result Value Ref Range   Preg Test, Ur NEGATIVE NEGATIVE  Results for orders placed or performed during the hospital encounter of 02/23/16 (from the past 336 hour(s))  CBC   Collection Time: 02/23/16  8:40 AM  Result Value Ref Range   WBC 7.6 4.0 - 10.5 K/uL   RBC 4.21 3.87 - 5.11 MIL/uL   Hemoglobin 13.0 12.0 - 15.0 g/dL   HCT 16.138.5 09.636.0 - 04.546.0 %   MCV 91.4 78.0 - 100.0 fL  MCH 30.9 26.0 - 34.0 pg   MCHC 33.8 30.0 - 36.0 g/dL   RDW 16.1 09.6 - 04.5 %   Platelets 360 150 - 400 K/uL    Assessment:  Bartholin's gland abscess  Plan: Patient will undergo surgical management with removal of Bartholin's gland resection possible Bartholin's gland marsupilization.   The risks of surgery were discussed in detail with the patient including but not limited to: bleeding which may require transfusion or reoperation; infection which may require antibiotics; injury to surrounding organs which may involve bowel, bladder, ureters ; need for additional procedures including laparoscopy or laparotomy; thromboembolic phenomenon, surgical  site problems and other postoperative/anesthesia complications. Likelihood of success in alleviating the patient's condition was discussed. Routine postoperative instructions will be reviewed with the patient and her family in detail after surgery.  The patient concurred with the proposed plan, giving informed written consent for the surgery.  Patient has been NPO since last night she will remain NPO for procedure.  Anesthesia and OR aware.  Preoperative prophylactic antibiotics and SCDs ordered on call to the OR.  To OR when ready.  Jaliah Foody L. Erin Fulling, M.D., [facility] 02/27/2016 2:43 PM

## 2016-02-27 NOTE — Discharge Instructions (Addendum)
Bartholin Cyst or Abscess A Bartholin cyst is a fluid-filled sac that forms on a Bartholin gland. Bartholin glands are small glands that are located within the folds of skin (labia) along the sides of the lower opening of the vagina. These glands produce a fluid to moisten the outside of the vagina during sexual intercourse. A Bartholin cyst causes a bulge on the side of the vagina. A cyst that is not large or infected may not cause symptoms or problems. However, if the fluid within the cyst becomes infected, the cyst can turn into an abscess. An abscess may cause discomfort or pain. CAUSES A Bartholin cyst may develop when the duct of the gland becomes blocked. In many cases, the cause of this is not known. Various kinds of bacteria can cause the cyst to become infected and develop into an abscess. RISK FACTORS You may be at an increased risk of developing a Bartholin cyst or abscess if:  You are a woman of reproductive age.  You have a history of previous Bartholin cysts or abscesses.  You have diabetes.  You have a sexually transmitted disease (STD). SIGNS AND SYMPTOMS The severity of symptoms varies depending on the size of the cyst and whether it is infected. Symptoms may include:  A bulge or swelling near the lower opening of your vagina.  Discomfort or pain.  Redness.  Pain during sexual intercourse.  Pain when walking.  Fluid draining from the area. DIAGNOSIS Your health care provider may make a diagnosis based on your symptoms and a physical exam. He or she will look for swelling in your vaginal area. Blood tests may be done to check for infections. A sample of fluid from the cyst or abscess may also be taken to be tested in a lab. TREATMENT Small cysts that are not infected may not require any treatment. These often go away on their own. Yourhealth care provider will recommend hot baths and the use of warm compresses. These may also be part of the treatment for an abscess.  Treatment options for a large cyst or abscess may include:   Antibiotic medicine.  A surgical procedure to drain the abscess. One of the following procedures may be done:  Incision and drainage. An incision is made in the cyst or abscess so that the fluid drains out. A catheter may be placed inside the cyst so that it does not close and fill up with fluid again. The catheter will be removed after you have a follow-up visit with a specialist (gynecologist).  Marsupialization. The cyst or abscess is opened and kept open by stitching the edges of the skin to the walls of the cyst or abscess. This allows it to continue to drain and not fill up with fluid again. If you have cysts or abscesses that keep returning and have required incision and drainage multiple times, your health care provider may talk to you about surgery to remove the Bartholin gland. HOME CARE INSTRUCTIONS  Take medicines only as directed by your health care provider.  If you were prescribed an antibiotic medicine, finish it all even if you start to feel better.  Apply warm, wet compresses to the area or take warm, shallow baths that cover your pelvic region (sitz baths) several times a day or as directed by your health care provider.  Do not squeeze the cyst or apply heavy pressure to it.  Do not have sexual intercourse until the cyst has gone away.  If your cyst or abscess was   opened, a small piece of gauze or a drain may have been placed in the area to allow drainage. Do not remove the gauze or the drain until directed by your health care provider. °· Wear feminine pads--not tampons--as needed for any drainage or bleeding. °· Keep all follow-up visits as directed by your health care provider. This is important. °PREVENTION °Take these steps to help prevent a Bartholin cyst from returning: °· Practice good hygiene.   °· Clean your vaginal area with mild soap and a soft cloth when you bathe. °· Practice safe sex to prevent  STDs. °SEEK MEDICAL CARE IF: °· You have increased pain, swelling, or redness in the area of the cyst. °· Puslike drainage is coming from the cyst. °· You have a fever. °  °This information is not intended to replace advice given to you by your health care provider. Make sure you discuss any questions you have with your health care provider. °  °Document Released: 11/04/2005 Document Revised: 11/25/2014 Document Reviewed: 06/20/2014 °Elsevier Interactive Patient Education ©2016 [employer] ° °Post Anesthesia Home Care Instructions ° °Activity: °Get plenty of rest for the remainder of the day. A responsible adult should stay with you for 24 hours following the procedure.  °For the next 24 hours, DO NOT: °-Drive a car °-Operate machinery °-Drink alcoholic beverages °-Take any medication unless instructed by your physician °-Make any legal decisions or sign important papers. ° °Meals: °Start with liquid foods such as gelatin or soup. Progress to regular foods as tolerated. Avoid greasy, spicy, heavy foods. If nausea and/or vomiting occur, drink only clear liquids until the nausea and/or vomiting subsides. Call your physician if vomiting continues. ° °Special Instructions/Symptoms: °Your throat may feel dry or sore from the anesthesia or the breathing tube placed in your throat during surgery. If this causes discomfort, gargle with warm salt water. The discomfort should disappear within 24 hours. ° °If you had a scopolamine patch placed behind your ear for the management of post- operative nausea and/or vomiting: ° °1. The medication in the patch is effective for 72 hours, after which it should be removed.  Wrap patch in a tissue and discard in the trash. Wash hands thoroughly with soap and water. °2. You may remove the patch earlier than 72 hours if you experience unpleasant side effects which may include dry mouth, dizziness or visual disturbances. °3. Avoid touching the patch. Wash your hands with soap and water  after contact with the patch. °  ° °

## 2016-02-27 NOTE — Transfer of Care (Signed)
Immediate Anesthesia Transfer of Care Note  Patient: Sherry Sosa  Procedure(s) Performed: Procedure(s): BARTHOLIN CYST RESECTION (N/A)  Patient Location: PACU  Anesthesia Type:General  Level of Consciousness: awake  Airway & Oxygen Therapy: Patient Spontanous Breathing  Post-op Assessment: Report given to PACU RN  Post vital signs: stable  Filed Vitals:   02/27/16 1244  BP: 128/77  Pulse: 70  Temp: 36.8 C  Resp: 20    Complications: No apparent anesthesia complications

## 2016-02-27 NOTE — Progress Notes (Signed)
Patient ID: Claretta FraiseYen Thi Mardene SayerHong Weppler, female   DOB: 02/18/1981, 35 y.o.   MRN: [id number]  Patient unable to void after 5 hours post op.  She has received 2 fluid boluses of 500mL each.  Will straight cath and place in prolonged observation.  If still hasn't voided in 4 hours post op, will place foley.  Levie HeritageJacob J Stinson, DO 02/27/2016 9:42 PM

## 2016-02-27 NOTE — Progress Notes (Signed)
Patient unable to void at 1900. Assisted patient in bathroom to use water bottle and running water to help void with no success. Patient went back to phase 11 and drank more ginger-ale and 2 cups of water. At 1925 assisted patient to bathroom again still unable to void after 15 minutes. Bladder scan done showed 140cc. Dr. Adrian BlackwaterStinson called ordered bolus of IV fluids. Bolus given then we attempted to void again without success. Scanned bladder again and got 240cc. Called Dr. Adrian BlackwaterStinson again and he came by to see patient. He requested patient walk around in unit for awhile. He will check on her again in little while.

## 2016-02-27 NOTE — Progress Notes (Signed)
Patient to bathroom unable to void prior to discharge instructions and after discharge instructions. Back to phase II drank more fluids. Support person at side.

## 2016-02-27 NOTE — Anesthesia Procedure Notes (Signed)
Procedure Name: LMA Insertion Date/Time: 02/27/2016 3:27 PM Performed by: Cephus ShellingBURGER, Laniya Friedl A Pre-anesthesia Checklist: Patient being monitored, Patient identified, Emergency Drugs available and Suction available Patient Re-evaluated:Patient Re-evaluated prior to inductionOxygen Delivery Method: Circle system utilized Preoxygenation: Pre-oxygenation with 100% oxygen Intubation Type: IV induction and Inhalational induction Ventilation: Mask ventilation without difficulty LMA: LMA inserted LMA Size: 4.0 Number of attempts: 1 Dental Injury: Teeth and Oropharynx as per pre-operative assessment

## 2016-02-28 ENCOUNTER — Encounter (HOSPITAL_COMMUNITY): Payer: Self-pay | Admitting: Obstetrics & Gynecology

## 2016-02-28 DIAGNOSIS — Z886 Allergy status to analgesic agent status: Secondary | ICD-10-CM | POA: Diagnosis not present

## 2016-02-28 DIAGNOSIS — N750 Cyst of Bartholin's gland: Secondary | ICD-10-CM | POA: Diagnosis not present

## 2016-02-28 DIAGNOSIS — Z91040 Latex allergy status: Secondary | ICD-10-CM | POA: Diagnosis not present

## 2016-02-28 DIAGNOSIS — Z91013 Allergy to seafood: Secondary | ICD-10-CM | POA: Diagnosis not present

## 2016-02-28 NOTE — Discharge Summary (Signed)
Physician Discharge Summary  Patient ID: Sherry Sosa MRN: [id number] DOB/AGE: [birth year] 35 y.o.  Admit date: 02/27/2016 Discharge date: 02/28/2016  Admission Diagnoses: Bartholin's gland cyst  Discharge Diagnoses:  Principal Problem:   Bartholin's gland abscess Active Problems:   Status post surgery   Discharged Condition: good  Hospital Course: Patient had an uncomplicated surgery; for further details of this surgery, please refer to the operative note. Pt was admitted for observation because she was unable to void post op/  She has subsequently been voiding well although she says that it takes a while to begin her stream. By time of discharge, her pain was controlled on oral pain medications; she was ambulating and tolerating regular diet. She was deemed stable for discharge to home.    Consults: None  Significant Diagnostic Studies: labs: CMP  Treatments: surgery: Left Bartholin's gland resection  Discharge Exam: Blood pressure 113/65, pulse 59, temperature 98.5 F (36.9 C), temperature source Oral, resp. rate 18, height 5\' 2"  (1.575 m), weight 128 lb (58.06 kg), last menstrual period 01/28/2016, SpO2 100 %. General appearance: alert and no distress Pelvic: min swelling on left labia majus- as expected post op.  No edema or erythema.  Disposition: 01-Home or Self Care  Discharge Instructions    Call MD for:  persistant nausea and vomiting    Complete by:  As directed      Call MD for:  redness, tenderness, or signs of infection (pain, swelling, redness, odor or green/yellow discharge around incision site)    Complete by:  As directed      Call MD for:  severe uncontrolled pain    Complete by:  As directed      Call MD for:  temperature >100.4    Complete by:  As directed      Diet general    Complete by:  As directed      Discharge patient    Complete by:  As directed   To home     Driving Restrictions    Complete by:  As directed   No driving for 24 hours     Increase activity slowly    Complete by:  As directed      Sexual Activity Restrictions    Complete by:  As directed   No sexual activity for 4 weeks            Medication List    STOP taking these medications        acetaminophen 325 MG tablet  Commonly known as:  TYLENOL      TAKE these medications        cetirizine 10 MG tablet  Commonly known as:  ZYRTEC  Take 10 mg by mouth daily.     cycloSPORINE 0.05 % ophthalmic emulsion  Commonly known as:  RESTASIS  Place 1 drop into both eyes 2 (two) times daily.     norethindrone-ethinyl estradiol 1-20 MG-MCG tablet  Commonly known as:  JUNEL FE,GILDESS FE,LOESTRIN FE  Take 1 tablet by mouth daily.     oxyCODONE-acetaminophen 5-325 MG tablet  Commonly known as:  PERCOCET/ROXICET  Take 1-2 tablets by mouth every 6 (six) hours as needed.     PAZEO 0.7 % Soln  Generic drug:  Olopatadine HCl  Place 1 drop into both eyes daily.     ranitidine 150 MG tablet  Commonly known as:  ZANTAC  Take 150 mg by mouth daily as needed for heartburn.     sulfamethoxazole-trimethoprim 800-160  MG tablet  Commonly known as:  BACTRIM DS,SEPTRA DS  Take 1 tablet by mouth 2 (two) times daily.     traMADol 50 MG tablet  Commonly known as:  ULTRAM  Take 1 tablet (50 mg total) by mouth every 6 (six) hours as needed.           Follow-up Information    Follow up with Willodean Rosenthal, MD In 2 weeks.   Specialty:  Obstetrics and Gynecology   Contact information:   [address] [place] [address] [phone]       Signed: Willodean Rosenthal 02/28/2016, 11:25 AM

## 2016-02-28 NOTE — Progress Notes (Signed)
Pt has been voiding without difficulty since 0110 until 0510 with a total out put  of 2100 cc.

## 2016-02-28 NOTE — Addendum Note (Signed)
Addendum  created 02/28/16 1046 by Orlie Pollenebra R Lamorris Knoblock, CRNA   Modules edited: Clinical Notes   Clinical Notes:  File: [id number]

## 2016-02-28 NOTE — Anesthesia Postprocedure Evaluation (Signed)
Anesthesia Post Note  Patient: Sherry Sosa - Sherry Sosa  Procedure(s) Performed: Procedure(s) (LRB): BARTHOLIN CYST RESECTION (N/A)  Patient location during evaluation: [facility] Anesthesia Type: General Level of consciousness: awake and alert, oriented, patient cooperative and awake Pain management: pain level not controlled Vital Signs Assessment: post-procedure vital signs reviewed and stable Respiratory status: spontaneous breathing, nonlabored ventilation and respiratory function stable Cardiovascular status: blood pressure returned to baseline and stable Postop Assessment: adequate PO intake Anesthetic complications: yes Comments: Pain level 8  Instructed to contact RN for pain med prior to Discharge.    Last Vitals:  Filed Vitals:   02/28/16 0511 02/28/16 1000  BP: 132/68 113/65  Pulse: 67 59  Temp: 36.7 C 36.9 C  Resp: 18 18    Last Pain:  Filed Vitals:   02/28/16 1036  PainSc: 3                  Caswell Alvillar

## 2016-02-28 NOTE — Progress Notes (Signed)
Discharge teaching complete. Pt understood all instructions and did not have any questions. Pt ambulated out of the hospital and discharged home to family.  

## 2016-03-18 ENCOUNTER — Ambulatory Visit (INDEPENDENT_AMBULATORY_CARE_PROVIDER_SITE_OTHER): Payer: BLUE CROSS/BLUE SHIELD | Admitting: Obstetrics & Gynecology

## 2016-03-18 ENCOUNTER — Encounter: Payer: Self-pay | Admitting: Obstetrics & Gynecology

## 2016-03-18 VITALS — BP 128/85 | HR 82 | Ht 62.0 in | Wt 124.0 lb

## 2016-03-18 DIAGNOSIS — Z9889 Other specified postprocedural states: Secondary | ICD-10-CM

## 2016-03-18 DIAGNOSIS — N751 Abscess of Bartholin's gland: Secondary | ICD-10-CM | POA: Diagnosis not present

## 2016-03-18 NOTE — Patient Instructions (Signed)
Bartholin Cyst or Abscess A Bartholin cyst is a fluid-filled sac that forms on a Bartholin gland. Bartholin glands are small glands that are located within the folds of skin (labia) along the sides of the lower opening of the vagina. These glands produce a fluid to moisten the outside of the vagina during sexual intercourse. A Bartholin cyst causes a bulge on the side of the vagina. A cyst that is not large or infected may not cause symptoms or problems. However, if the fluid within the cyst becomes infected, the cyst can turn into an abscess. An abscess may cause discomfort or pain. CAUSES A Bartholin cyst may develop when the duct of the gland becomes blocked. In many cases, the cause of this is not known. Various kinds of bacteria can cause the cyst to become infected and develop into an abscess. RISK FACTORS You may be at an increased risk of developing a Bartholin cyst or abscess if:  You are a woman of reproductive age.  You have a history of previous Bartholin cysts or abscesses.  You have diabetes.  You have a sexually transmitted disease (STD). SIGNS AND SYMPTOMS The severity of symptoms varies depending on the size of the cyst and whether it is infected. Symptoms may include:  A bulge or swelling near the lower opening of your vagina.  Discomfort or pain.  Redness.  Pain during sexual intercourse.  Pain when walking.  Fluid draining from the area. DIAGNOSIS Your health care provider may make a diagnosis based on your symptoms and a physical exam. He or she will look for swelling in your vaginal area. Blood tests may be done to check for infections. A sample of fluid from the cyst or abscess may also be taken to be tested in a lab. TREATMENT Small cysts that are not infected may not require any treatment. These often go away on their own. Yourhealth care provider will recommend hot baths and the use of warm compresses. These may also be part of the treatment for an abscess.  Treatment options for a large cyst or abscess may include:   Antibiotic medicine.  A surgical procedure to drain the abscess. One of the following procedures may be done:  Incision and drainage. An incision is made in the cyst or abscess so that the fluid drains out. A catheter may be placed inside the cyst so that it does not close and fill up with fluid again. The catheter will be removed after you have a follow-up visit with a specialist (gynecologist).  Marsupialization. The cyst or abscess is opened and kept open by stitching the edges of the skin to the walls of the cyst or abscess. This allows it to continue to drain and not fill up with fluid again. If you have cysts or abscesses that keep returning and have required incision and drainage multiple times, your health care provider may talk to you about surgery to remove the Bartholin gland. HOME CARE INSTRUCTIONS  Take medicines only as directed by your health care provider.  If you were prescribed an antibiotic medicine, finish it all even if you start to feel better.  Apply warm, wet compresses to the area or take warm, shallow baths that cover your pelvic region (sitz baths) several times a day or as directed by your health care provider.  Do not squeeze the cyst or apply heavy pressure to it.  Do not have sexual intercourse until the cyst has gone away.  If your cyst or abscess was   opened, a small piece of gauze or a drain may have been placed in the area to allow drainage. Do not remove the gauze or the drain until directed by your health care provider.  Wear feminine pads--not tampons--as needed for any drainage or bleeding.  Keep all follow-up visits as directed by your health care provider. This is important. PREVENTION Take these steps to help prevent a Bartholin cyst from returning:  Practice good hygiene.   Clean your vaginal area with mild soap and a soft cloth when you bathe.  Practice safe sex to prevent  STDs. SEEK MEDICAL CARE IF:  You have increased pain, swelling, or redness in the area of the cyst.  Puslike drainage is coming from the cyst.  You have a fever.   This information is not intended to replace advice given to you by your health care provider. Make sure you discuss any questions you have with your health care provider.   Document Released: 11/04/2005 Document Revised: 11/25/2014 Document Reviewed: 06/20/2014 Elsevier Interactive Patient Education 2016 [employer]  

## 2016-03-18 NOTE — Progress Notes (Signed)
Patient ID: Claretta FraiseYen Thi Mardene SayerHong Kalina, female   DOB: 1981/03/09, 35 y.o.   MRN: [id number] History:  35 y.o. G0P0000 here today for 2 week post op check following Bartholin's gland cystectomy.  Pt doing well.  She reports occ drainage of fluid.  Pain greatly improved. No fever or erythema.  She is back to her usual daily activities.     The following portions of the patient's history were reviewed and updated as appropriate: allergies, current medications, past family history, past medical history, past social history, past surgical history and problem list.  Review of Systems:  Pertinent items are noted in HPI.  Objective:  Physical Exam Blood pressure 128/85, pulse 82, height 5\' 2"  (1.575 m), weight 124 lb (56.246 kg). Gen: NAD Pelvic: Normal appearing external genitalia; normal appearing vaginal mucosa and cervix.  Normal discharge.  Left labia healing well. No erythema or drainage.    02/27/2016 Diagnosis Bartholin's gland, cyst - CONSISTENT WITH RUPTURED/INFLAMED BARTHOLIN'S GLAND CYST. - THERE IS NO EVIDENCE OF MALIGNANCY. JOSHUA KISH Assessment & Plan:  2 week post op check doing well F/u in 3 months for annual  Pt wants full STI screen at that time  Beaver Springsarolyn L. Harraway-Smith, M.D., Evern CoreFACOG

## 2016-03-25 ENCOUNTER — Ambulatory Visit: Payer: BLUE CROSS/BLUE SHIELD | Admitting: Obstetrics & Gynecology

## 2016-08-19 DIAGNOSIS — Z23 Encounter for immunization: Secondary | ICD-10-CM | POA: Diagnosis not present

## 2016-08-22 ENCOUNTER — Encounter: Payer: Self-pay | Admitting: Obstetrics & Gynecology

## 2016-08-22 ENCOUNTER — Ambulatory Visit (INDEPENDENT_AMBULATORY_CARE_PROVIDER_SITE_OTHER): Payer: BLUE CROSS/BLUE SHIELD | Admitting: Obstetrics & Gynecology

## 2016-08-22 VITALS — BP 137/85 | HR 79 | Ht 62.0 in | Wt 131.0 lb

## 2016-08-22 DIAGNOSIS — Z01419 Encounter for gynecological examination (general) (routine) without abnormal findings: Secondary | ICD-10-CM | POA: Diagnosis not present

## 2016-08-22 DIAGNOSIS — Z1151 Encounter for screening for human papillomavirus (HPV): Secondary | ICD-10-CM | POA: Diagnosis not present

## 2016-08-22 DIAGNOSIS — L709 Acne, unspecified: Secondary | ICD-10-CM

## 2016-08-22 DIAGNOSIS — Z113 Encounter for screening for infections with a predominantly sexual mode of transmission: Secondary | ICD-10-CM | POA: Diagnosis not present

## 2016-08-22 DIAGNOSIS — N83201 Unspecified ovarian cyst, right side: Secondary | ICD-10-CM | POA: Diagnosis not present

## 2016-08-22 DIAGNOSIS — R102 Pelvic and perineal pain: Secondary | ICD-10-CM

## 2016-08-22 DIAGNOSIS — Z124 Encounter for screening for malignant neoplasm of cervix: Secondary | ICD-10-CM | POA: Diagnosis not present

## 2016-08-22 DIAGNOSIS — N83209 Unspecified ovarian cyst, unspecified side: Secondary | ICD-10-CM | POA: Diagnosis not present

## 2016-08-22 DIAGNOSIS — R21 Rash and other nonspecific skin eruption: Secondary | ICD-10-CM

## 2016-08-22 DIAGNOSIS — E7800 Pure hypercholesterolemia, unspecified: Secondary | ICD-10-CM

## 2016-08-22 DIAGNOSIS — J302 Other seasonal allergic rhinitis: Secondary | ICD-10-CM

## 2016-08-22 DIAGNOSIS — Z3041 Encounter for surveillance of contraceptive pills: Secondary | ICD-10-CM

## 2016-08-22 LAB — CBC
HEMATOCRIT: 45.8 % — AB (ref 35.0–45.0)
Hemoglobin: 14.8 g/dL (ref 11.7–15.5)
MCH: 31 pg (ref 27.0–33.0)
MCHC: 32.3 g/dL (ref 32.0–36.0)
MCV: 95.8 fL (ref 80.0–100.0)
MPV: 8.9 fL (ref 7.5–12.5)
PLATELETS: 380 10*3/uL (ref 140–400)
RBC: 4.78 MIL/uL (ref 3.80–5.10)
RDW: 13.2 % (ref 11.0–15.0)
WBC: 6.4 10*3/uL (ref 3.8–10.8)

## 2016-08-22 MED ORDER — NORETHIN ACE-ETH ESTRAD-FE 1-20 MG-MCG PO TABS: 1.0000 | ORAL_TABLET | Freq: Every day | ORAL | 4 refills | Status: DC

## 2016-08-22 MED ORDER — BENZOYL PEROXIDE-ERYTHROMYCIN 5-3 % EX GEL: Freq: Two times a day (BID) | CUTANEOUS | 6 refills | Status: DC

## 2016-08-22 MED ORDER — HYDROCORTISONE 2.5 % EX CREA: TOPICAL_CREAM | Freq: Two times a day (BID) | CUTANEOUS | 2 refills | Status: DC

## 2016-08-22 MED ORDER — MONTELUKAST SODIUM 10 MG PO TABS: 10.0000 mg | ORAL_TABLET | Freq: Every day | ORAL | 6 refills | Status: DC

## 2016-08-22 NOTE — Progress Notes (Signed)
Subjective:     Sherry Sosa is a 35 y.o. female here for a routine exam.  Current complaints: Pt recently discovered tha there partner of 2 years has been cheating on her. She wants a full STI screen. She reports a h/o an ov cyst and reports that she was supposed to have a sono to check for resolution. She was [facility] by [facility].  She brought her records with her.  She does not have a PCP and wants her health maintenance labs done today.        Gynecologic History Patient's last menstrual period was 08/21/2016. Contraception: OCP (estrogen/progesterone) Last Pap: 2 years prev. Results were: normal Last mammogram: n/a.   Obstetric History OB History  Gravida Para Term Preterm AB Living  0 0 0 0 0 0  SAB TAB Ectopic Multiple Live Births  0 0 0 0          The following portions of the patient's history were reviewed and updated as appropriate: allergies, current medications, past family history, past medical history, past social history, past surgical history and problem list.  Review of Systems Pertinent items are noted in HPI.    Objective:   BP 137/85 (BP Location: Left Arm)   Pulse 79   Ht 5\' 2"  (1.575 m)   Wt 131 lb (59.4 kg)   LMP 08/21/2016   BMI 23.96 kg/m  General Appearance:    Alert, cooperative, no distress, appears stated age  Head:    Normocephalic, without obvious abnormality, atraumatic  Eyes:    conjunctiva/corneas clear, EOM's intact, both eyes  Ears:    Normal external ear canals, both ears  Nose:   Nares normal, septum midline, mucosa normal, no drainage    or sinus tenderness  Throat:   Lips, mucosa, and tongue normal; teeth and gums normal  Neck:   Supple, symmetrical, trachea midline, no adenopathy;    thyroid:  no enlargement/tenderness/nodules  Back:     Symmetric, no curvature, ROM normal, no CVA tenderness  Lungs:     Clear to auscultation bilaterally, respirations unlabored  Chest Wall:    No tenderness or deformity   Heart:    Regular rate  and rhythm, S1 and S2 normal, no murmur, rub   or gallop  Breast Exam:    No tenderness, masses, or nipple abnormality  Abdomen:     Soft, non-tender, bowel sounds active all four quadrants,    no masses, no organomegaly  Genitalia:    Normal female without lesion, discharge or tenderness     Extremities:   Extremities normal, atraumatic, no cyanosis or edema  Pulses:   2+ and symmetric all extremities  Skin:   Skin color, texture, turgor normal, no rashes or lesions     Assessment:    Healthy female exam.   STI screen  H/o Elevated chol- no meds H/o R ov cyst surveillance of OCPs   Plan:    Follow up in: 1 year.  F/u PAP and cx STI Labs: HIV, RPR, hepatitis panel Labs: Vit D, TSH, CBC, CMP, lipid panel Refill OCPs Junel 1/20 for 1 year Refilled: Singulair Benzamycin Hydrocirtisone 2.5% Pelvic US   Sherry Sosa L. Harraway-Smith, M.D., Evern CoreFACOG

## 2016-08-23 ENCOUNTER — Ambulatory Visit (HOSPITAL_BASED_OUTPATIENT_CLINIC_OR_DEPARTMENT_OTHER)
Admission: RE | Admit: 2016-08-23 | Discharge: 2016-08-23 | Disposition: A | Discharge status: Home / Self Care | Payer: BLUE CROSS/BLUE SHIELD | Source: Ambulatory Visit | Attending: Obstetrics & Gynecology | Admitting: Obstetrics & Gynecology

## 2016-08-23 DIAGNOSIS — R102 Pelvic and perineal pain: Secondary | ICD-10-CM

## 2016-08-23 DIAGNOSIS — N83209 Unspecified ovarian cyst, unspecified side: Secondary | ICD-10-CM | POA: Diagnosis not present

## 2016-08-23 DIAGNOSIS — N83201 Unspecified ovarian cyst, right side: Secondary | ICD-10-CM | POA: Diagnosis not present

## 2016-08-23 LAB — COMPREHENSIVE METABOLIC PANEL
ALK PHOS: 46 U/L (ref 33–115)
ALT: 25 U/L (ref 6–29)
AST: 26 U/L (ref 10–30)
Albumin: 4.9 g/dL (ref 3.6–5.1)
BUN: 6 mg/dL — AB (ref 7–25)
CALCIUM: 10.1 mg/dL (ref 8.6–10.2)
CHLORIDE: 100 mmol/L (ref 98–110)
CO2: 25 mmol/L (ref 20–31)
Creat: 0.75 mg/dL (ref 0.50–1.10)
GLUCOSE: 78 mg/dL (ref 65–99)
POTASSIUM: 4.3 mmol/L (ref 3.5–5.3)
Sodium: 138 mmol/L (ref 135–146)
Total Bilirubin: 0.3 mg/dL (ref 0.2–1.2)
Total Protein: 7.7 g/dL (ref 6.1–8.1)

## 2016-08-23 LAB — TSH: TSH: 1.77 m[IU]/L

## 2016-08-23 LAB — HEPATITIS PANEL, ACUTE
HCV AB: NEGATIVE
HEP A IGM: NONREACTIVE
HEP B C IGM: NONREACTIVE
HEP B S AG: NEGATIVE

## 2016-08-23 LAB — HIV ANTIBODY (ROUTINE TESTING W REFLEX): HIV: NONREACTIVE

## 2016-08-23 LAB — LIPID PANEL
CHOL/HDL RATIO: 4 ratio (ref <=5.0)
Cholesterol: 266 mg/dL — ABNORMAL HIGH (ref 125–200)
HDL: 67 mg/dL (ref >=46)
LDL Cholesterol: 141 mg/dL — ABNORMAL HIGH (ref <130)
Triglycerides: 288 mg/dL — ABNORMAL HIGH (ref <150)
VLDL: 58 mg/dL — AB (ref <30)

## 2016-08-23 LAB — RPR

## 2016-08-25 LAB — VITAMIN D 1,25 DIHYDROXY
VITAMIN D3 1, 25 (OH): 33 pg/mL
Vitamin D 1, 25 (OH)2 Total: 33 pg/mL (ref 18–72)

## 2016-08-26 ENCOUNTER — Encounter: Payer: Self-pay | Admitting: Obstetrics & Gynecology

## 2016-08-26 LAB — CYTOLOGY - PAP

## 2016-08-28 LAB — CERVICOVAGINAL ANCILLARY ONLY: HERPES (WINDOWPATH): NEGATIVE

## 2016-09-05 ENCOUNTER — Telehealth: Payer: Self-pay

## 2016-09-05 NOTE — Telephone Encounter (Signed)
-----   Message from Willodean Rosenthalarolyn Harraway-Smith, MD sent at 08/26/2016 11:08 AM EDT ----- Please refer pt to primary care for eval of abnormal lipid panel  Thx, clh-S

## 2016-09-05 NOTE — Telephone Encounter (Signed)
Left message for patient to return call to office.  Jennifer Howard RNBSN 

## 2016-11-12 DIAGNOSIS — H40033 Anatomical narrow angle, bilateral: Secondary | ICD-10-CM | POA: Diagnosis not present

## 2016-11-12 DIAGNOSIS — H578 Other specified disorders of eye and adnexa: Secondary | ICD-10-CM | POA: Diagnosis not present

## 2017-08-07 ENCOUNTER — Encounter: Payer: Self-pay | Admitting: Family Medicine

## 2017-08-18 DIAGNOSIS — Z23 Encounter for immunization: Secondary | ICD-10-CM | POA: Diagnosis not present

## 2017-09-01 ENCOUNTER — Encounter: Payer: Self-pay | Admitting: Obstetrics & Gynecology

## 2017-09-01 ENCOUNTER — Ambulatory Visit (INDEPENDENT_AMBULATORY_CARE_PROVIDER_SITE_OTHER): Payer: BLUE CROSS/BLUE SHIELD | Admitting: Obstetrics & Gynecology

## 2017-09-01 VITALS — BP 135/88 | HR 81 | Ht 62.0 in | Wt 134.0 lb

## 2017-09-01 DIAGNOSIS — E782 Mixed hyperlipidemia: Secondary | ICD-10-CM

## 2017-09-01 DIAGNOSIS — L709 Acne, unspecified: Secondary | ICD-10-CM

## 2017-09-01 DIAGNOSIS — Z113 Encounter for screening for infections with a predominantly sexual mode of transmission: Secondary | ICD-10-CM

## 2017-09-01 DIAGNOSIS — K2960 Other gastritis without bleeding: Secondary | ICD-10-CM

## 2017-09-01 DIAGNOSIS — L700 Acne vulgaris: Secondary | ICD-10-CM

## 2017-09-01 DIAGNOSIS — Z3041 Encounter for surveillance of contraceptive pills: Secondary | ICD-10-CM

## 2017-09-01 DIAGNOSIS — Z01419 Encounter for gynecological examination (general) (routine) without abnormal findings: Secondary | ICD-10-CM | POA: Diagnosis not present

## 2017-09-01 DIAGNOSIS — J302 Other seasonal allergic rhinitis: Secondary | ICD-10-CM

## 2017-09-01 MED ORDER — NORETHIN ACE-ETH ESTRAD-FE 1-20 MG-MCG PO TABS: 1.0000 | ORAL_TABLET | Freq: Every day | ORAL | 4 refills | Status: DC

## 2017-09-01 MED ORDER — MONTELUKAST SODIUM 10 MG PO TABS: 10.0000 mg | ORAL_TABLET | Freq: Every day | ORAL | 6 refills | Status: DC

## 2017-09-01 MED ORDER — BENZOYL PEROXIDE-ERYTHROMYCIN 5-3 % EX GEL: Freq: Two times a day (BID) | CUTANEOUS | 6 refills | Status: DC

## 2017-09-01 NOTE — Progress Notes (Signed)
Subjective:     Fanta Wimberley Rickles is a 36 y.o. female here for a routine exam.  G0 LMP 07/2017  Current complaints: No.   Pt does not exercise. Sexually active. New partner this year.    Pt requests another PAP with HPV testing as her prev partner had anogenital warts.    Gynecologic History Patient's last menstrual period was 08/13/2017. Contraception: OCP (estrogen/progesterone) Last Pap: neg. Results were: normal neg hrHPV Last mammogram: n/a. Results were: normal  Obstetric History OB History  Gravida Para Term Preterm AB Living  0 0 0 0 0 0  SAB TAB Ectopic Multiple Live Births  0 0 0 0           The following portions of the patient's history were reviewed and updated as appropriate: allergies, current medications, past family history, past medical history, past social history, past surgical history and problem list.  Review of Systems Pertinent items are noted in HPI.    Objective:  BP 135/88   Pulse 81   Ht '5\' 2"'  (1.575 m)   Wt 134 lb (60.8 kg)   LMP 08/13/2017   BMI 24.51 kg/m   General Appearance:    Alert, cooperative, no distress, appears stated age  Head:    Normocephalic, without obvious abnormality, atraumatic  Eyes:    conjunctiva/corneas clear, EOM's intact, both eyes  Ears:    Normal external ear canals, both ears  Nose:   Nares normal, septum midline, mucosa normal, no drainage    or sinus tenderness  Throat:   Lips, mucosa, and tongue normal; teeth and gums normal  Neck:   Supple, symmetrical, trachea midline, no adenopathy;    thyroid:  no enlargement/tenderness/nodules  Back:     Symmetric, no curvature, ROM normal, no CVA tenderness  Lungs:     Clear to auscultation bilaterally, respirations unlabored  Chest Wall:    No tenderness or deformity   Heart:    Regular rate and rhythm, S1 and S2 normal, no murmur, rub   or gallop  Breast Exam:    No tenderness, masses, or nipple abnormality  Abdomen:     Soft, non-tender, bowel sounds active all four  quadrants,    no masses, no organomegaly  Genitalia:    Normal female without lesion, discharge or tenderness     Extremities:   Extremities normal, atraumatic, no cyanosis or edema  Pulses:   2+ and symmetric all extremities  Skin:   Skin color, texture, turgor normal, no rashes or lesions     Assessment:    Healthy female exam.    Plan:   Marva was seen today for gynecologic exam.  Diagnoses and all orders for this visit:  Acne vulgaris -     benzoyl peroxide-erythromycin (BENZAMYCIN) gel; Apply topically 2 (two) times daily.  Encounter for surveillance of contraceptive pills -     norethindrone-ethinyl estradiol (JUNEL FE,GILDESS FE,LOESTRIN FE) 1-20 MG-MCG tablet; Take 1 tablet by mouth daily.  Acne, unspecified acne type -     benzoyl peroxide-erythromycin (BENZAMYCIN) gel; Apply topically 2 (two) times daily.  Seasonal allergies -     montelukast (SINGULAIR) 10 MG tablet; Take 1 tablet (10 mg total) by mouth at bedtime.  Reflux gastritis -     H. pylori breath test  Encounter for gynecological examination with Papanicolaou smear of cervix -     Comp Met (CMET)  Elevated cholesterol with elevated triglycerides -     Lipid panel  Routine screening for  STI (sexually transmitted infection) -     HIV antibody -     RPR -     Hepatitis panel, acute  Janiel Derhammer L. Harraway-Smith, M.D., [name]

## 2017-09-01 NOTE — Addendum Note (Signed)
Addended by: Kathie Dike on: 09/01/2017 08:53 AM   Modules accepted: Orders

## 2017-09-02 LAB — LIPID PANEL
CHOL/HDL RATIO: 3.9 ratio (ref 0.0–4.4)
Cholesterol, Total: 250 mg/dL — ABNORMAL HIGH (ref 100–199)
HDL: 64 mg/dL (ref >39)
LDL CALC: 142 mg/dL — AB (ref 0–99)
TRIGLYCERIDES: 222 mg/dL — AB (ref 0–149)
VLDL Cholesterol Cal: 44 mg/dL — ABNORMAL HIGH (ref 5–40)

## 2017-09-02 LAB — COMPREHENSIVE METABOLIC PANEL
ALT: 33 IU/L — AB (ref 0–32)
AST: 33 IU/L (ref 0–40)
Albumin/Globulin Ratio: 1.8 (ref 1.2–2.2)
Albumin: 4.7 g/dL (ref 3.5–5.5)
Alkaline Phosphatase: 51 IU/L (ref 39–117)
BUN/Creatinine Ratio: 16 (ref 9–23)
BUN: 12 mg/dL (ref 6–20)
Bilirubin Total: 0.4 mg/dL (ref 0.0–1.2)
CALCIUM: 9.5 mg/dL (ref 8.7–10.2)
CO2: 22 mmol/L (ref 20–29)
CREATININE: 0.77 mg/dL (ref 0.57–1.00)
Chloride: 99 mmol/L (ref 96–106)
GFR calc Af Amer: 114 mL/min/{1.73_m2} (ref >59)
GFR, EST NON AFRICAN AMERICAN: 99 mL/min/{1.73_m2} (ref >59)
GLUCOSE: 84 mg/dL (ref 65–99)
Globulin, Total: 2.6 g/dL (ref 1.5–4.5)
Potassium: 4.2 mmol/L (ref 3.5–5.2)
Sodium: 137 mmol/L (ref 134–144)
Total Protein: 7.3 g/dL (ref 6.0–8.5)

## 2017-09-02 LAB — HEPATITIS PANEL, ACUTE
HEP A IGM: NEGATIVE
HEP B S AG: NEGATIVE
Hep B C IgM: NEGATIVE
Hep C Virus Ab: <0.1 s/co ratio (ref 0.0–0.9)

## 2017-09-02 LAB — HIV ANTIBODY (ROUTINE TESTING W REFLEX): HIV SCREEN 4TH GENERATION: NONREACTIVE

## 2017-09-02 LAB — RPR: RPR: NONREACTIVE

## 2017-09-03 LAB — CYTOLOGY - PAP
CHLAMYDIA, DNA PROBE: NEGATIVE
Diagnosis: NEGATIVE
HPV (WINDOPATH): NOT DETECTED
NEISSERIA GONORRHEA: NEGATIVE

## 2017-09-03 LAB — H.PYLORI BREATH TEST (REFLEX): H. PYLORI BREATH TEST: POSITIVE — AB

## 2017-09-03 LAB — H. PYLORI BREATH TEST

## 2017-09-05 ENCOUNTER — Telehealth: Payer: Self-pay | Admitting: *Deleted

## 2017-09-05 ENCOUNTER — Encounter: Payer: Self-pay | Admitting: Obstetrics & Gynecology

## 2017-09-05 NOTE — Telephone Encounter (Signed)
Placed call to pt to review lab results. Pt made aware that it is recommended to f/u with PCP for further eval and management.  Pt states that she currently does not have a PCP, Dr SwazilandJordan has relocated. Pt request f/u with Dr Erin FullingHarraway-Smith in regards to tx for H.Pylori and referral for PCP.  Pt made aware message will be sent to provider for f/u tx options and PCP referral.    Please advise.

## 2017-09-05 NOTE — Telephone Encounter (Signed)
-----   Message from Willodean Rosenthalarolyn Harraway-Smith, MD sent at 09/04/2017  4:55 PM EDT ----- Regarding: RE: recent labs Yes. Thanks. Please call pt. She needs to f/u with her PCP for hPylori eval and lipid management.  I will also forward her labs to Dr. SwazilandJordan.  Thx, clh-S   ----- Message ----- From: Lanney GinsFoster, Serafin [place] D, CMA Sent: 09/04/2017   3:12 PM To: Willodean Rosenthalarolyn Harraway-Smith, MD Subject: recent labs                                    I have reports from recent lab work. Pt has Positive H.Pylori and elevated lipid panel.  Please advise on H.Pylori.  Can recommend PCP follow up and diet information re: lipids.   Thanks, Rosalita ChessmanSuzanne

## 2017-09-08 ENCOUNTER — Other Ambulatory Visit: Payer: Self-pay

## 2017-09-08 DIAGNOSIS — A048 Other specified bacterial intestinal infections: Secondary | ICD-10-CM

## 2017-09-08 NOTE — Progress Notes (Signed)
Referral place to [facility]. Armandina StammerJennifer Howard RNBSN

## 2017-09-11 ENCOUNTER — Encounter: Payer: Self-pay | Admitting: Family Medicine

## 2017-09-11 ENCOUNTER — Ambulatory Visit (INDEPENDENT_AMBULATORY_CARE_PROVIDER_SITE_OTHER): Payer: BLUE CROSS/BLUE SHIELD | Admitting: Family Medicine

## 2017-09-11 VITALS — BP 138/78 | HR 73 | Temp 98.6°F | Ht 62.0 in | Wt 135.5 lb

## 2017-09-11 DIAGNOSIS — A048 Other specified bacterial intestinal infections: Secondary | ICD-10-CM

## 2017-09-11 DIAGNOSIS — E559 Vitamin D deficiency, unspecified: Secondary | ICD-10-CM

## 2017-09-11 LAB — VITAMIN D 25 HYDROXY (VIT D DEFICIENCY, FRACTURES): VITD: 53.97 ng/mL (ref 30.00–100.00)

## 2017-09-11 MED ORDER — CLARITHROMYCIN 500 MG PO TABS: 500.0000 mg | ORAL_TABLET | Freq: Two times a day (BID) | ORAL | 0 refills | Status: AC

## 2017-09-11 MED ORDER — AMOXICILLIN 500 MG PO CAPS: 1000.0000 mg | ORAL_CAPSULE | Freq: Two times a day (BID) | ORAL | 0 refills | Status: AC

## 2017-09-11 MED ORDER — LANSOPRAZOLE 30 MG PO CPDR: 30.0000 mg | DELAYED_RELEASE_CAPSULE | Freq: Two times a day (BID) | ORAL | 0 refills | Status: DC

## 2017-09-11 NOTE — Progress Notes (Signed)
Chief Complaint  Patient presents with  . Establish Care    H Pylori       New Patient Visit SUBJECTIVE: HPI: Sherry Sosa is an 36 y.o.female who is being seen for establishing care.   The patient was previously seen at GYN only.   She has a several year history of emesis after meals.  She takes Zantac.  Her GYN did not believe this was normally tested for H. pylori.  It was positive.  She is referred here for further management.  Symptoms are currently improved, she is taking Zantac a couple times per week.  She denies bleeding, weight loss, injury, nighttime awakenings, fevers.  Allergies  Allergen Reactions  . Tuna [Fish Allergy] Anaphylaxis  . Advil [Ibuprofen] Swelling  . Latex Hives   Past Medical History:  Diagnosis Date  . No known health problems      Past Surgical History:  Procedure Laterality Date  . BARTHOLIN CYST MARSUPIALIZATION N/A 02/27/2016   Procedure: BARTHOLIN CYST RESECTION;  Surgeon: Willodean Rosenthalarolyn Harraway-Smith, MD;  Location: [facility];  Service: Gynecology;  Laterality: N/A;   Social History   Social History  . Marital status: Single   Social History Main Topics  . Smoking status: Never Smoker  . Smokeless tobacco: Never Used  . Alcohol use No  . Drug use: No  . Sexual activity: Yes    Birth control/ protection: Pill   Family History  Problem Relation Age of Onset  . Cancer Neg Hx   . Hypertension Neg Hx   . Stroke Neg Hx     Current Outpatient Prescriptions:  .  amoxicillin (AMOXIL) 500 MG capsule, Take 2 capsules (1,000 mg total) by mouth 2 (two) times daily., Disp: 56 capsule, Rfl: 0 .  benzoyl peroxide-erythromycin (BENZAMYCIN) gel, Apply topically 2 (two) times daily., Disp: 23.3 g, Rfl: 6 .  cetirizine (ZYRTEC) 10 MG tablet, Take 10 mg by mouth daily., Disp: , Rfl:  .  clarithromycin (BIAXIN) 500 MG tablet, Take 1 tablet (500 mg total) by mouth 2 (two) times daily., Disp: 28 tablet, Rfl: 0 .  cycloSPORINE (RESTASIS) 0.05 % ophthalmic  emulsion, Place 1 drop into both eyes 2 (two) times daily., Disp: , Rfl:  .  hydrocortisone 2.5 % cream, Apply topically 2 (two) times daily. (Patient not taking: Reported on 09/01/2017), Disp: 30 g, Rfl: 2 .  lansoprazole (PREVACID) 30 MG capsule, Take 1 capsule (30 mg total) by mouth 2 (two) times daily before a meal., Disp: 28 capsule, Rfl: 0 .  montelukast (SINGULAIR) 10 MG tablet, Take 1 tablet (10 mg total) by mouth at bedtime., Disp: 30 tablet, Rfl: 6 .  norethindrone-ethinyl estradiol (JUNEL FE,GILDESS FE,LOESTRIN FE) 1-20 MG-MCG tablet, Take 1 tablet by mouth daily., Disp: 3 Package, Rfl: 4 .  Olopatadine HCl (PAZEO) 0.7 % SOLN, Place 1 drop into both eyes daily., Disp: , Rfl:  .  ranitidine (ZANTAC) 150 MG tablet, Take 150 mg by mouth daily as needed for heartburn., Disp: , Rfl:    Patient's last menstrual period was 08/13/2017.  ROS Const: Denies fevers  GI: As noted in HPI   OBJECTIVE: BP 138/78 (BP Location: Left Arm, Patient Position: Sitting, Cuff Size: Normal)   Pulse 73   Temp 98.6 F (37 C) (Oral)   Ht 5\' 2"  (1.575 m)   Wt 135 lb 8 oz (61.5 kg)   LMP 08/13/2017   SpO2 97%   BMI 24.78 kg/m   Constitutional: -  VS reviewed -  Well developed, well nourished, appears stated age -  No apparent distress  Psychiatric: -  Oriented to person, place, and time -  Memory intact -  Affect and mood normal -  Fluent conversation, good eye contact -  Judgment and insight age appropriate  Eye: -  Conjunctivae clear, no discharge -  Pupils symmetric, round, reactive to light  ENMT: -  MMM    Pharynx moist, no exudate, no erythema  Neck: -  No gross swelling, no palpable masses -  Thyroid midline, not enlarged, mobile, no palpable masses  Cardiovascular: -  RRR -  No [place]t edema  Respiratory: -  Normal respiratory effort, no accessory muscle use, no retraction -  Breath sounds equal, no wheezes, no ronchi, no crackles  Gastrointestinal: -  Bowel sounds normal -  Mild TTP in  RUQ, no distention, no guarding, no masses  Skin: -  No significant lesion on inspection -  Warm and dry to palpation   ASSESSMENT/PLAN: H. pylori infection - Plan: amoxicillin (AMOXIL) 500 MG capsule, lansoprazole (PREVACID) 30 MG capsule, clarithromycin (BIAXIN) 500 MG tablet  Vitamin D deficiency - Plan: Vitamin D (25 hydroxy)  Will tx. Check Vit D, she is supplementing. Counseled on diet and exercise, healthy diet handout given.  Patient should return prn. The patient voiced understanding and agreement to the plan.   Jilda Roche Minong, DO 09/11/17  10:15 AM

## 2017-09-11 NOTE — Progress Notes (Signed)
Pre visit review using our clinic review tool, if applicable. No additional management support is needed unless otherwise documented below in the visit note. 

## 2017-09-11 NOTE — Patient Instructions (Signed)
Things to look out for: bleeding, unintentional weight loss, nighttime awakenings or fevers.  Healthy Eating Plan Many factors influence your heart health, including eating and exercise habits. Heart (coronary) risk increases with abnormal blood fat (lipid) levels. Heart-healthy meal planning includes limiting unhealthy fats, increasing healthy fats, and making other small dietary changes. This includes maintaining a healthy body weight to help keep lipid levels within a normal range.  WHAT IS MY PLAN?  Your health care provider recommends that you:  Drink a glass of water before meals to help with satiety.  Eat slowly.  An alternative to the water is to add Metamucil. This will help with satiety as well. It does contain calories, unlike water.  WHAT TYPES OF FAT SHOULD I CHOOSE?  Choose healthy fats more often. Choose monounsaturated and polyunsaturated fats, such as olive oil and canola oil, flaxseeds, walnuts, almonds, and seeds.  Eat more omega-3 fats. Good choices include salmon, mackerel, sardines, tuna, flaxseed oil, and ground flaxseeds. Aim to eat fish at least two times each week.  Avoid foods with partially hydrogenated oils in them. These contain trans fats. Examples of foods that contain trans fats are stick margarine, some tub margarines, cookies, crackers, and other baked goods. If you are going to avoid a fat, this is the one to avoid!  WHAT GENERAL GUIDELINES DO I NEED TO FOLLOW?  Check food labels carefully to identify foods with trans fats. Avoid these types of options when possible.  Fill one half of your plate with vegetables and green salads. Eat 4-5 servings of vegetables per day. A serving of vegetables equals 1 cup of raw leafy vegetables,  cup of raw or cooked cut-up vegetables, or  cup of vegetable juice.  Fill one fourth of your plate with whole grains. Look for the word "whole" as the first word in the ingredient list.  Fill one fourth of your plate with  lean protein foods.  Eat 4-5 servings of fruit per day. A serving of fruit equals one medium whole fruit,  cup of dried fruit,  cup of fresh, frozen, or canned fruit. Try to avoid fruits in cups/syrups as the sugar content can be high.  Eat more foods that contain soluble fiber. Examples of foods that contain this type of fiber are apples, broccoli, carrots, beans, peas, and barley. Aim to get 20-30 g of fiber per day.  Eat more home-cooked food and less restaurant, buffet, and fast food.  Limit or avoid alcohol.  Limit foods that are high in starch and sugar.  Avoid fried foods when able.  Cook foods by using methods other than frying. Baking, boiling, grilling, and broiling are all great options. Other fat-reducing suggestions include: ? Removing the skin from poultry. ? Removing all visible fats from meats. ? Skimming the fat off of stews, soups, and gravies before serving them. ? Steaming vegetables in water or broth.  Lose weight if you are overweight. Losing just 5-10% of your initial body weight can help your overall health and prevent diseases such as diabetes and heart disease.  Increase your consumption of nuts, legumes, and seeds to 4-5 servings per week. One serving of dried beans or legumes equals  cup after being cooked, one serving of nuts equals 1 ounces, and one serving of seeds equals  ounce or 1 tablespoon.  WHAT ARE GOOD FOODS CAN I EAT? Grains Grainy breads (try to find bread that is 3 g of fiber per slice or greater), oatmeal, light popcorn. Whole-grain cereals.  Rice and pasta, including brown rice and those that are made with whole wheat. Edamame pasta is a great alternative to grain pasta. It has a higher protein content. Try to avoid significant consumption of white bread, sugary cereals, or pastries/baked goods.  Vegetables All vegetables. Cooked white potatoes do not count as vegetables.  Fruits All fruits, but limit pineapple and bananas as these  fruits have a higher sugar content.  Meats and Other Protein Sources Lean, well-trimmed beef, veal, pork, and lamb. Chicken and [place] without skin. All fish and shellfish. Wild duck, rabbit, pheasant, and venison. Egg whites or low-cholesterol egg substitutes. Dried beans, peas, lentils, and tofu.Seeds and most nuts.  Dairy Low-fat or nonfat cheeses, including ricotta, string, and mozzarella. Skim or 1% milk that is liquid, powdered, or evaporated. Buttermilk that is made with low-fat milk. Nonfat or low-fat yogurt. Soy/Almond milk are good alternatives if you cannot handle dairy.  Beverages Water is the best for you. Sports drinks with less sugar are more desirable unless you are a highly active athlete.  Sweets and Desserts Sherbets and fruit ices. Honey, jam, marmalade, jelly, and syrups. Dark chocolate.  Eat all sweets and desserts in moderation.  Fats and Oils Nonhydrogenated (trans-free) margarines. Vegetable oils, including soybean, sesame, sunflower, olive, peanut, safflower, corn, canola, and cottonseed. Salad dressings or mayonnaise that are made with a vegetable oil. Limit added fats and oils that you use for cooking, baking, salads, and as spreads.  Other Cocoa powder. Coffee and tea. Most condiments.  The items listed above may not be a complete list of recommended foods or beverages. Contact your dietitian for more options.

## 2017-09-17 ENCOUNTER — Encounter: Payer: Self-pay | Admitting: Family Medicine

## 2017-10-07 IMAGING — US US PELVIS COMPLETE
1 series · 14 of 25 positions shown · non-contrast
Comparison: CT 07/06/2015

CLINICAL DATA: Right ovarian cyst, follow-up

EXAM:
TRANSABDOMINAL AND TRANSVAGINAL ULTRASOUND OF PELVIS
TECHNIQUE: Both transabdominal and transvaginal ultrasound examinations of the
pelvis were performed. Transabdominal technique was performed for
global imaging of the pelvis including uterus, ovaries, adnexal
regions, and pelvic cul-de-sac. It was necessary to proceed with
endovaginal exam following the transabdominal exam to visualize the
endometrium and ovaries.

[Series 1: us pelvis complete · 0.21mm/px · 14 of 57 slices shown]
[im 1/57]
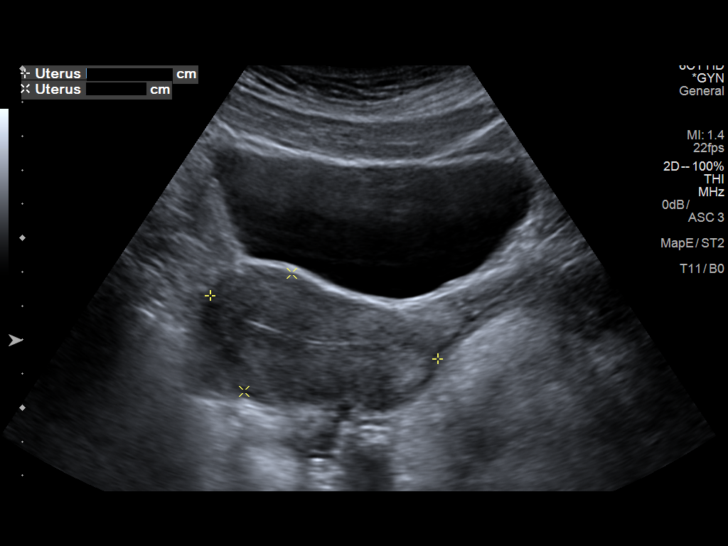
[im 5/57]
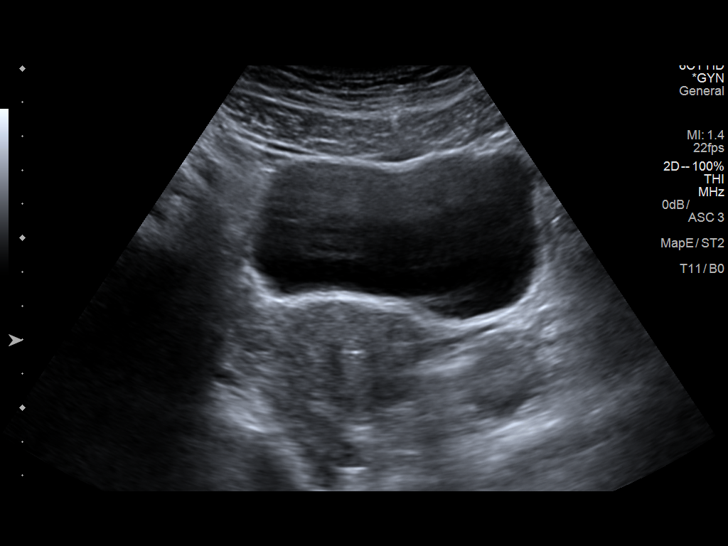
[im 10/57]
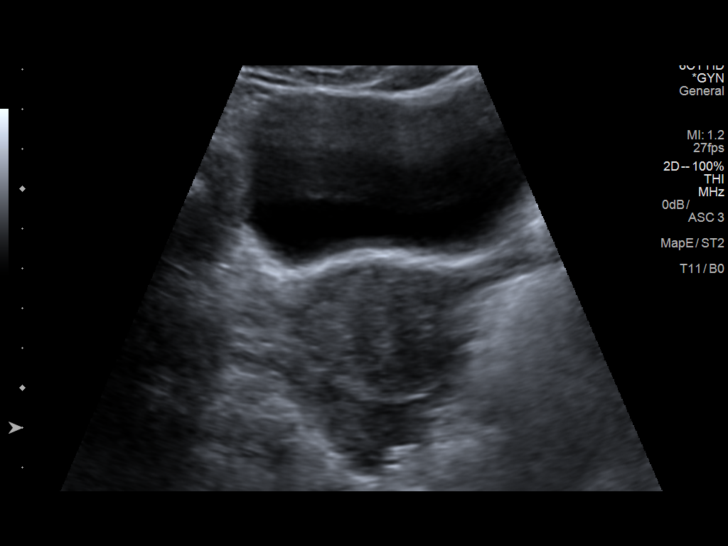
[im 15/57]
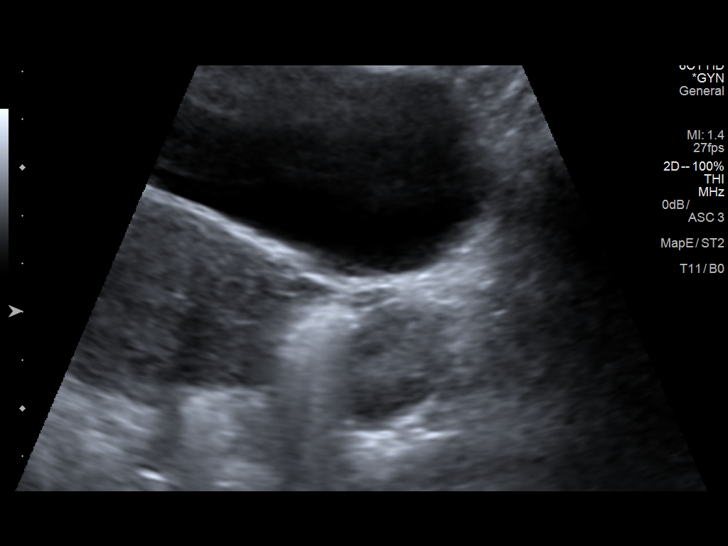
[im 19/57]
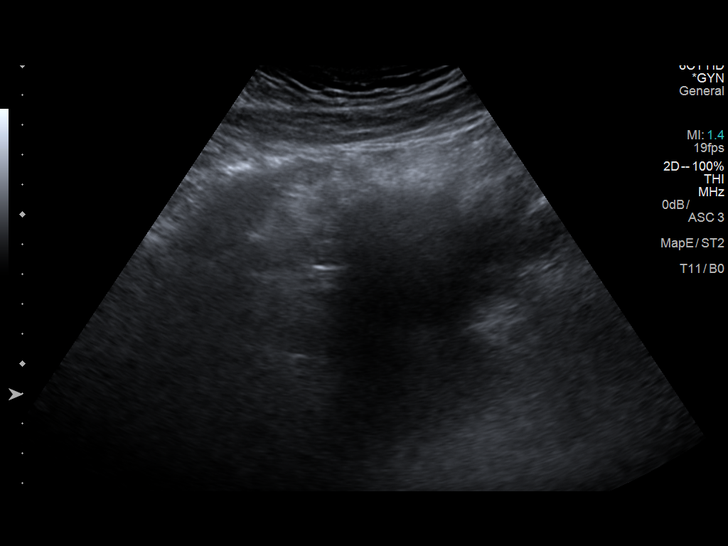
[im 22/57]
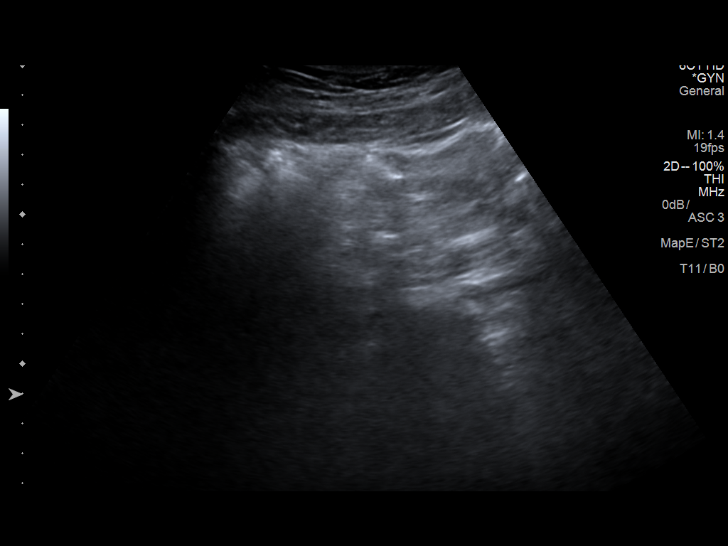
[im 26/57]
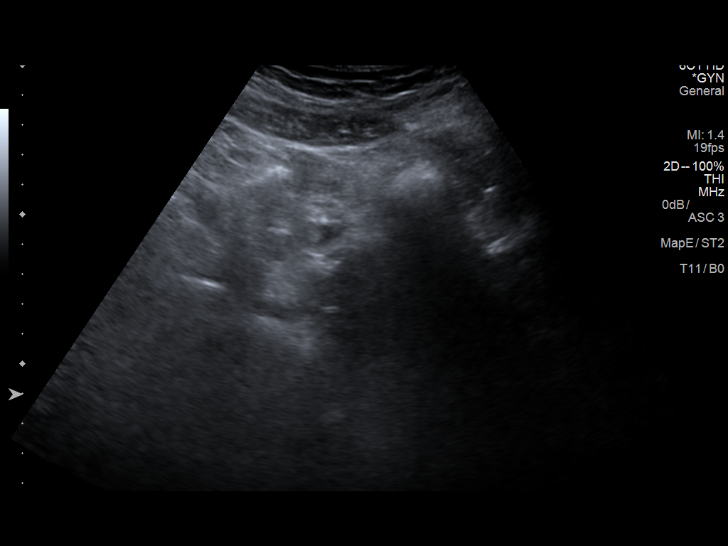
[im 31/57]
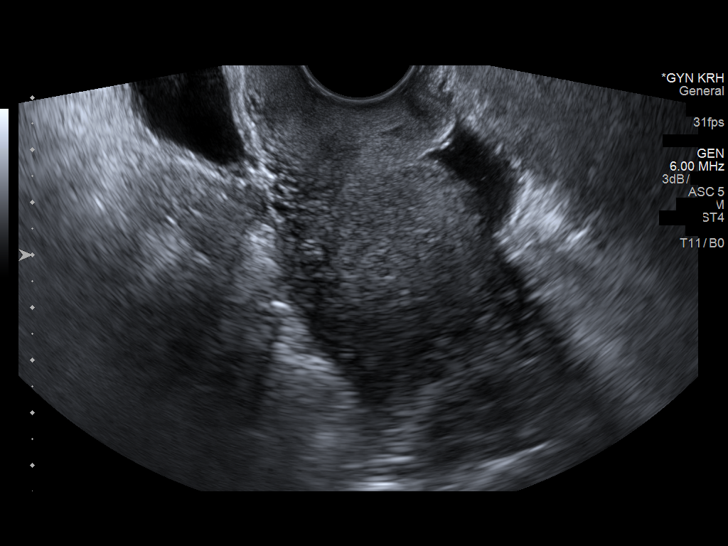
[im 36/57]
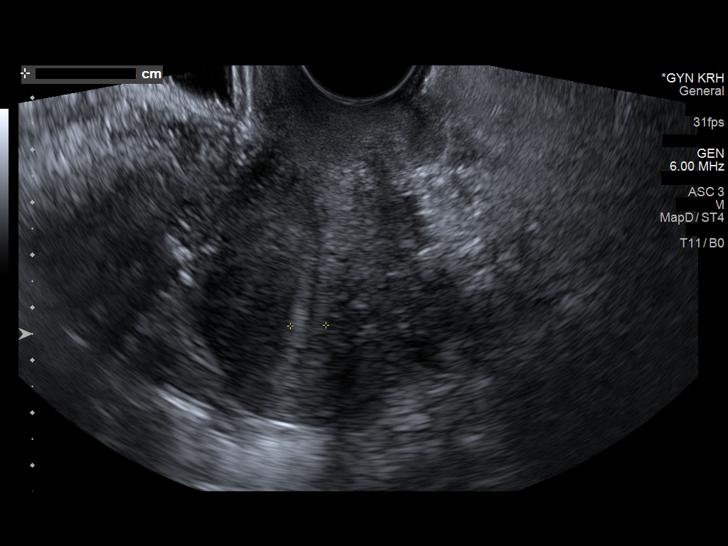
[im 38/57]
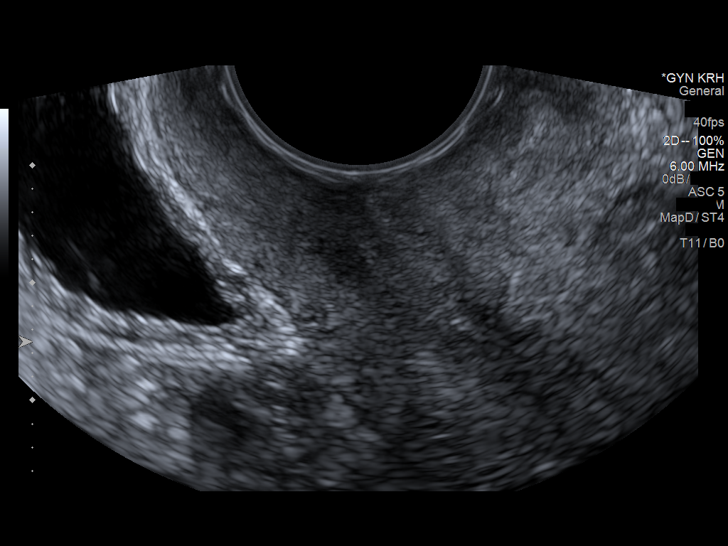
[im 43/57]
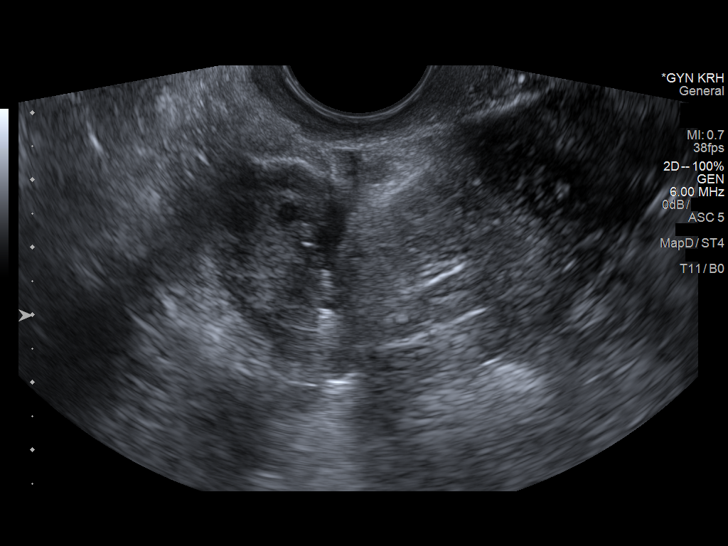
[im 47/57]
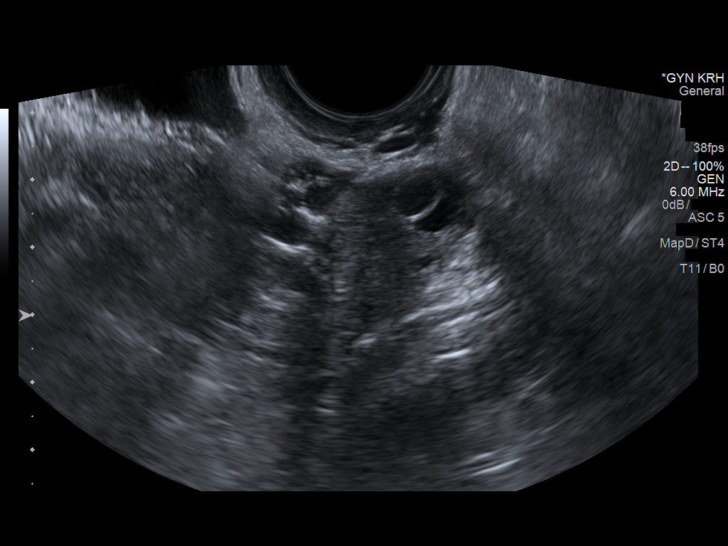
[im 52/57]
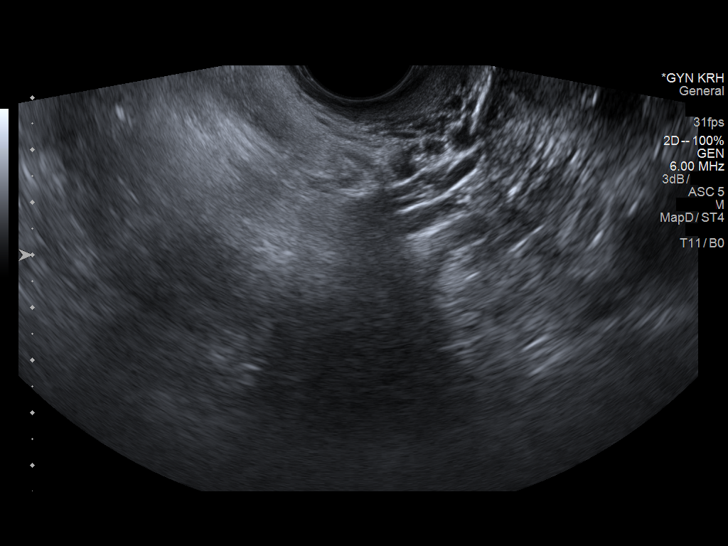
[im 57/57]
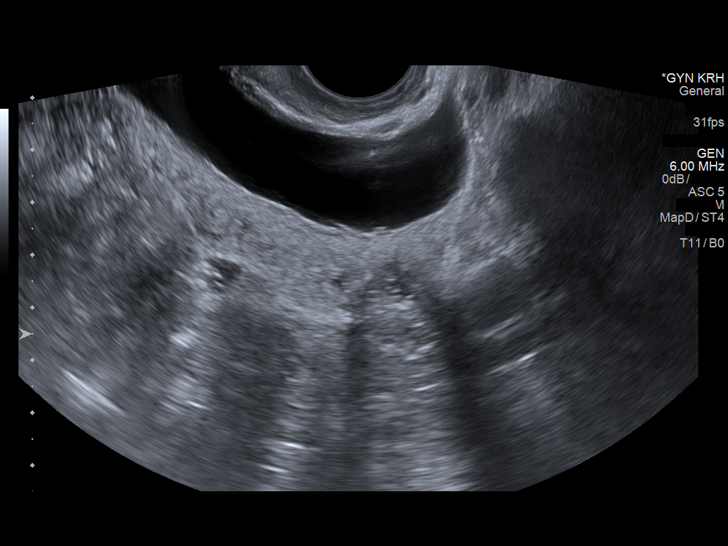

[14 of 25 positions shown; findings below may reference images not displayed]

FINDINGS: Uterus

Measurements: 7.0 x 4.4 x 4.9 cm. No fibroids or other mass
visualized.

Endometrium

Thickness: 7 mm in thickness.  No focal abnormality visualized.

Right ovary

Measurements: 3.7 x 2.2 x 1.8 cm. Normal appearance/no adnexal mass.

Left ovary

Measurements: 3.4 x 2.4 x 3.1 cm. Normal appearance/no adnexal mass.

Other findings

Trace free fluid in the pelvis.
IMPRESSION: No ovarian or adnexal cyst/mass.  Unremarkable study.

## 2018-05-18 ENCOUNTER — Telehealth: Payer: Self-pay

## 2018-05-18 NOTE — Telephone Encounter (Signed)
Pt's pharmacy called to request a refill on her Montelukast 10mg . Pt advised to contact her primary care physician for a refill on this medication. Understanding was voiced.

## 2018-08-19 DIAGNOSIS — Z23 Encounter for immunization: Secondary | ICD-10-CM | POA: Diagnosis not present

## 2018-09-03 ENCOUNTER — Encounter: Payer: Self-pay | Admitting: Obstetrics & Gynecology

## 2018-09-03 ENCOUNTER — Ambulatory Visit (INDEPENDENT_AMBULATORY_CARE_PROVIDER_SITE_OTHER): Payer: BLUE CROSS/BLUE SHIELD | Admitting: Obstetrics & Gynecology

## 2018-09-03 VITALS — BP 132/86 | HR 80 | Ht 62.0 in | Wt 137.0 lb

## 2018-09-03 DIAGNOSIS — J302 Other seasonal allergic rhinitis: Secondary | ICD-10-CM

## 2018-09-03 DIAGNOSIS — Z01419 Encounter for gynecological examination (general) (routine) without abnormal findings: Secondary | ICD-10-CM

## 2018-09-03 DIAGNOSIS — R21 Rash and other nonspecific skin eruption: Secondary | ICD-10-CM

## 2018-09-03 DIAGNOSIS — Z1151 Encounter for screening for human papillomavirus (HPV): Secondary | ICD-10-CM | POA: Diagnosis not present

## 2018-09-03 DIAGNOSIS — Z124 Encounter for screening for malignant neoplasm of cervix: Secondary | ICD-10-CM

## 2018-09-03 DIAGNOSIS — L700 Acne vulgaris: Secondary | ICD-10-CM

## 2018-09-03 DIAGNOSIS — Z3041 Encounter for surveillance of contraceptive pills: Secondary | ICD-10-CM

## 2018-09-03 DIAGNOSIS — Z3009 Encounter for other general counseling and advice on contraception: Secondary | ICD-10-CM

## 2018-09-03 DIAGNOSIS — L709 Acne, unspecified: Secondary | ICD-10-CM

## 2018-09-03 MED ORDER — NORETHIN ACE-ETH ESTRAD-FE 1-20 MG-MCG PO TABS: 1.0000 | ORAL_TABLET | Freq: Every day | ORAL | 4 refills | Status: DC

## 2018-09-03 MED ORDER — HYDROCORTISONE 2.5 % EX CREA: TOPICAL_CREAM | Freq: Two times a day (BID) | CUTANEOUS | 3 refills | Status: DC

## 2018-09-03 MED ORDER — BENZOYL PEROXIDE-ERYTHROMYCIN 5-3 % EX GEL: Freq: Two times a day (BID) | CUTANEOUS | 6 refills | Status: DC

## 2018-09-03 MED ORDER — MONTELUKAST SODIUM 10 MG PO TABS: 10.0000 mg | ORAL_TABLET | Freq: Every day | ORAL | 6 refills | Status: DC

## 2018-09-03 NOTE — Progress Notes (Signed)
Last pap- 09/01/17- normal Pt requests pap every year  Pt c/o stress rashes

## 2018-09-03 NOTE — Progress Notes (Signed)
Subjective:     Sherry Sosa is a 37 y.o. female here for a routine exam. LMP: monthly for 2 days. Occ comes during the withdrawal pills.  Current complaints: rash on her skin that pt thinks might be a 'stress rash'. Pt notes it mainly in the am when she wakes [place]. It is improved with hydrocortisone cream.     Gynecologic History Patient's last menstrual period was 08/07/2018. Contraception: OCP (estrogen/progesterone) Last Pap: 2018. Results were: normal. Pt requests repeat Last mammogram: n/a.  Obstetric History OB History  Gravida Para Term Preterm AB Living  0 0 0 0 0 0  SAB TAB Ectopic Multiple Live Births  0 0 0 0     The following portions of the patient's history were reviewed and updated as appropriate: allergies, current medications, past family history, past medical history, past social history, past surgical history and problem list.  Review of Systems Pertinent items are noted in HPI.    Objective:  BP 132/86   Pulse 80   Ht 5\' 2"  (1.575 m)   Wt 137 lb (62.1 kg)   LMP 08/07/2018   BMI 25.06 kg/m   General Appearance:    Alert, cooperative, no distress, appears stated age  Head:    Normocephalic, without obvious abnormality, atraumatic  Eyes:    conjunctiva/corneas clear, EOM's intact, both eyes  Ears:    Normal external ear canals, both ears  Nose:   Nares normal, septum midline, mucosa normal, no drainage    or sinus tenderness  Throat:   Lips, mucosa, and tongue normal; teeth and gums normal  Neck:   Supple, symmetrical, trachea midline, no adenopathy;    thyroid:  no enlargement/tenderness/nodules  Back:     Symmetric, no curvature, ROM normal, no CVA tenderness  Lungs:     Clear to auscultation bilaterally, respirations unlabored  Chest Wall:    No tenderness or deformity   Heart:    Regular rate and rhythm, S1 and S2 normal, no murmur, rub   or gallop  Breast Exam:    No tenderness, masses, or nipple abnormality  Abdomen:     Soft, non-tender, bowel  sounds active all four quadrants,    no masses, no organomegaly  Genitalia:    Normal female without lesion, discharge or tenderness     Extremities:   Extremities normal, atraumatic, no cyanosis or edema  Pulses:   2+ and symmetric all extremities  Skin:   Skin color, texture, turgor normal, there are a few small red papules on pts thigh and on her back.      Sharde was seen today for gynecologic exam.  Diagnoses and all orders for this visit:  Well female exam with routine gynecological exam -     Comprehensive metabolic panel -     Lipid panel -     Vitamin D 1,25 dihydroxy -     CBC -     Cytology - PAP  Rash and nonspecific skin eruption  Acne, unspecified acne type -     benzoyl peroxide-erythromycin (BENZAMYCIN) gel; Apply topically 2 (two) times daily.  Acne vulgaris -     benzoyl peroxide-erythromycin (BENZAMYCIN) gel; Apply topically 2 (two) times daily.  Rash of body -     hydrocortisone 2.5 % cream; Apply topically 2 (two) times daily.  Seasonal allergies -     montelukast (SINGULAIR) 10 MG tablet; Take 1 tablet (10 mg total) by mouth at bedtime.  Encounter for surveillance of contraceptive  pills -     norethindrone-ethinyl estradiol (JUNEL FE,GILDESS FE,LOESTRIN FE) 1-20 MG-MCG tablet; Take 1 tablet by mouth daily.  Encounter for counseling regarding contraception  f/u in 1 year or sooner prn.    Cover mattress with matress pad. Wash sheets and blankets in hot water.   Catarino Vold L. Harraway-Smith, M.D., [facility]

## 2018-09-08 ENCOUNTER — Telehealth: Payer: Self-pay

## 2018-09-08 LAB — COMPREHENSIVE METABOLIC PANEL
A/G RATIO: 1.9 (ref 1.2–2.2)
ALT: 29 IU/L (ref 0–32)
AST: 24 IU/L (ref 0–40)
Albumin: 4.8 g/dL (ref 3.5–5.5)
Alkaline Phosphatase: 44 IU/L (ref 39–117)
BILIRUBIN TOTAL: 0.3 mg/dL (ref 0.0–1.2)
BUN / CREAT RATIO: 18 (ref 9–23)
BUN: 13 mg/dL (ref 6–20)
CHLORIDE: 102 mmol/L (ref 96–106)
CO2: 23 mmol/L (ref 20–29)
Calcium: 9.8 mg/dL (ref 8.7–10.2)
Creatinine, Ser: 0.71 mg/dL (ref 0.57–1.00)
GFR calc non Af Amer: 108 mL/min/{1.73_m2} (ref >59)
GFR, EST AFRICAN AMERICAN: 125 mL/min/{1.73_m2} (ref >59)
Globulin, Total: 2.5 g/dL (ref 1.5–4.5)
Glucose: 96 mg/dL (ref 65–99)
POTASSIUM: 4.5 mmol/L (ref 3.5–5.2)
Sodium: 138 mmol/L (ref 134–144)
TOTAL PROTEIN: 7.3 g/dL (ref 6.0–8.5)

## 2018-09-08 LAB — CBC
Hematocrit: 44.1 % (ref 34.0–46.6)
Hemoglobin: 14.2 g/dL (ref 11.1–15.9)
MCH: 30.6 pg (ref 26.6–33.0)
MCHC: 32.2 g/dL (ref 31.5–35.7)
MCV: 95 fL (ref 79–97)
PLATELETS: 359 10*3/uL (ref 150–450)
RBC: 4.64 x10E6/uL (ref 3.77–5.28)
RDW: 12.5 % (ref 12.3–15.4)
WBC: 7.2 10*3/uL (ref 3.4–10.8)

## 2018-09-08 LAB — VITAMIN D 1,25 DIHYDROXY
VITAMIN D 1, 25 (OH) TOTAL: 58 pg/mL
VITAMIN D3 1, 25 (OH): 58 pg/mL

## 2018-09-08 LAB — LIPID PANEL
Chol/HDL Ratio: 3.7 ratio (ref 0.0–4.4)
Cholesterol, Total: 261 mg/dL — ABNORMAL HIGH (ref 100–199)
HDL: 70 mg/dL (ref >39)
LDL Calculated: 164 mg/dL — ABNORMAL HIGH (ref 0–99)
Triglycerides: 134 mg/dL (ref 0–149)
VLDL Cholesterol Cal: 27 mg/dL (ref 5–40)

## 2018-09-08 LAB — CYTOLOGY - PAP
DIAGNOSIS: NEGATIVE
HPV: DETECTED — AB

## 2018-09-08 NOTE — Telephone Encounter (Signed)
Patient called with questions about having HPV on her pap smear. Patient has never had this before.  Explained that HPV is sexually transmitted. Patient assured that this doesn't mean she has cervical cancer but it is important for her to have yearly pap smears to watch this. Patient upset on phone.   Answered all questions of patient. Sherry Stammer RN

## 2018-09-15 ENCOUNTER — Telehealth: Payer: Self-pay | Admitting: Obstetrics & Gynecology

## 2018-09-15 NOTE — Telephone Encounter (Signed)
TC to pt to answer question about HPV. Left message that she needs a repeat PAP in 1 year.   Virgia Kelner L. Harraway-Smith, M.D., [facility]

## 2018-09-16 ENCOUNTER — Ambulatory Visit (INDEPENDENT_AMBULATORY_CARE_PROVIDER_SITE_OTHER): Payer: BLUE CROSS/BLUE SHIELD

## 2018-09-16 VITALS — Ht 62.0 in | Wt 137.0 lb

## 2018-09-16 DIAGNOSIS — Z23 Encounter for immunization: Secondary | ICD-10-CM

## 2018-09-16 NOTE — Progress Notes (Addendum)
Patient received first of HPV 9 injection series. Patient tolerated injection well. Patient given VIS. Armandina Stammer RN  Attestation of Attending Supervision of RN: Evaluation and management procedures were performed by the nurse under my supervision and collaboration.  I have reviewed the nursing note and chart, and I agree with the management and plan.  Carolyn L. Harraway-Smith, M.D., [facility]

## 2018-11-09 ENCOUNTER — Ambulatory Visit (INDEPENDENT_AMBULATORY_CARE_PROVIDER_SITE_OTHER): Payer: BLUE CROSS/BLUE SHIELD

## 2018-11-09 DIAGNOSIS — Z23 Encounter for immunization: Secondary | ICD-10-CM

## 2018-11-09 NOTE — Progress Notes (Signed)
Patient presents for her 2nd Gaurdisil injection. Armandina StammerJennifer Howard RN

## 2018-12-18 DIAGNOSIS — R05 Cough: Secondary | ICD-10-CM | POA: Diagnosis not present

## 2018-12-18 DIAGNOSIS — J209 Acute bronchitis, unspecified: Secondary | ICD-10-CM | POA: Diagnosis not present

## 2018-12-18 DIAGNOSIS — J309 Allergic rhinitis, unspecified: Secondary | ICD-10-CM | POA: Diagnosis not present

## 2019-02-05 ENCOUNTER — Other Ambulatory Visit: Payer: Self-pay

## 2019-02-05 DIAGNOSIS — Z3041 Encounter for surveillance of contraceptive pills: Secondary | ICD-10-CM

## 2019-02-05 MED ORDER — NORETHIN ACE-ETH ESTRAD-FE 1-20 MG-MCG PO TABS: 1.0000 | ORAL_TABLET | Freq: Every day | ORAL | 4 refills | Status: DC

## 2019-03-15 ENCOUNTER — Ambulatory Visit: Payer: BLUE CROSS/BLUE SHIELD

## 2019-04-19 ENCOUNTER — Ambulatory Visit: Payer: BLUE CROSS/BLUE SHIELD

## 2019-04-22 ENCOUNTER — Ambulatory Visit (INDEPENDENT_AMBULATORY_CARE_PROVIDER_SITE_OTHER): Payer: BC Managed Care – PPO

## 2019-04-22 ENCOUNTER — Other Ambulatory Visit: Payer: Self-pay

## 2019-04-22 VITALS — Ht 62.0 in | Wt 137.0 lb

## 2019-04-22 DIAGNOSIS — Z23 Encounter for immunization: Secondary | ICD-10-CM | POA: Diagnosis not present

## 2019-04-22 NOTE — Progress Notes (Addendum)
Patient presents for last of Gardasil series. Patient due for annual exam in Oct so she will call back and schedule once that is closer. Armandina Stammer RN  Attestation of Attending Supervision of RN: Evaluation and management procedures were performed by the nurse under my supervision and collaboration.  I have reviewed the nursing note and chart, and I agree with the management and plan.  Carolyn L. Harraway-Smith, M.D., [facility]

## 2019-06-21 ENCOUNTER — Other Ambulatory Visit: Payer: Self-pay

## 2019-06-21 DIAGNOSIS — J302 Other seasonal allergic rhinitis: Secondary | ICD-10-CM

## 2019-06-21 MED ORDER — MONTELUKAST SODIUM 10 MG PO TABS: 10.0000 mg | ORAL_TABLET | Freq: Every day | ORAL | 6 refills | Status: DC

## 2019-08-20 DIAGNOSIS — Z23 Encounter for immunization: Secondary | ICD-10-CM | POA: Diagnosis not present

## 2019-09-20 ENCOUNTER — Encounter: Payer: Self-pay | Admitting: Obstetrics & Gynecology

## 2019-09-20 ENCOUNTER — Other Ambulatory Visit: Payer: Self-pay

## 2019-09-20 ENCOUNTER — Ambulatory Visit (INDEPENDENT_AMBULATORY_CARE_PROVIDER_SITE_OTHER): Payer: BC Managed Care – PPO | Admitting: Obstetrics & Gynecology

## 2019-09-20 VITALS — BP 136/77 | HR 72 | Wt 124.0 lb

## 2019-09-20 DIAGNOSIS — Z01419 Encounter for gynecological examination (general) (routine) without abnormal findings: Secondary | ICD-10-CM | POA: Diagnosis not present

## 2019-09-20 DIAGNOSIS — Z124 Encounter for screening for malignant neoplasm of cervix: Secondary | ICD-10-CM | POA: Diagnosis not present

## 2019-09-20 DIAGNOSIS — Z3041 Encounter for surveillance of contraceptive pills: Secondary | ICD-10-CM

## 2019-09-20 DIAGNOSIS — Z1151 Encounter for screening for human papillomavirus (HPV): Secondary | ICD-10-CM | POA: Diagnosis not present

## 2019-09-20 DIAGNOSIS — Z113 Encounter for screening for infections with a predominantly sexual mode of transmission: Secondary | ICD-10-CM

## 2019-09-20 DIAGNOSIS — L709 Acne, unspecified: Secondary | ICD-10-CM

## 2019-09-20 DIAGNOSIS — R21 Rash and other nonspecific skin eruption: Secondary | ICD-10-CM

## 2019-09-20 DIAGNOSIS — L700 Acne vulgaris: Secondary | ICD-10-CM

## 2019-09-20 DIAGNOSIS — J302 Other seasonal allergic rhinitis: Secondary | ICD-10-CM

## 2019-09-20 MED ORDER — HYDROCORTISONE 2.5 % EX CREA: TOPICAL_CREAM | Freq: Two times a day (BID) | CUTANEOUS | 3 refills | Status: DC

## 2019-09-20 MED ORDER — MONTELUKAST SODIUM 10 MG PO TABS: 10.0000 mg | ORAL_TABLET | Freq: Every day | ORAL | 6 refills | Status: DC

## 2019-09-20 MED ORDER — NORETHIN ACE-ETH ESTRAD-FE 1-20 MG-MCG PO TABS: 1.0000 | ORAL_TABLET | Freq: Every day | ORAL | 4 refills | Status: DC

## 2019-09-20 MED ORDER — BENZOYL PEROXIDE-ERYTHROMYCIN 5-3 % EX GEL: Freq: Two times a day (BID) | CUTANEOUS | 6 refills | Status: DC

## 2019-09-20 NOTE — Progress Notes (Signed)
Fasting labs today PAP Smear today with HPV STI testing

## 2019-09-20 NOTE — Progress Notes (Signed)
Subjective:     Sherry Sosa is a 38 y.o. female here for a routine exam. G0 on OCPs. Pt has monthly cycles. Current complaints: none. Lost ~10 3's during the pandemic due to exercise. Doing the 7 min workout 3x/day and running. Still working as an Ambulance person. Was off x 2.5 months during the pandemic. Remains occ sexually active. No new partners. Pt completed the HPV vaccine series last year.      Gynecologic History Patient's last menstrual period was 09/07/2019. Contraception: OCP (estrogen/progesterone) Last Pap: 09/03/2018. Results were: nornal with +hr HPVeg with  Last mammogram: n/a  Obstetric History OB History  Gravida Para Term Preterm AB Living  0 0 0 0 0 0  SAB TAB Ectopic Multiple Live Births  0 0 0 0     The following portions of the patient's history were reviewed and updated as appropriate: allergies, current medications, past family history, past medical history, past social history, past surgical history and problem list.  Review of Systems Pertinent items are noted in HPI.    Objective:  BP 136/77   Pulse 72   Wt 124 lb (56.2 kg)   LMP 09/07/2019   BMI 22.68 kg/m      General Appearance:    Alert, cooperative, no distress, appears stated age  Head:    Normocephalic, without obvious abnormality, atraumatic  Eyes:    conjunctiva/corneas clear, EOM's intact, both eyes  Ears:    Normal external ear canals, both ears  Nose:   Nares normal, septum midline, mucosa normal, no drainage    or sinus tenderness  Throat:   Lips, mucosa, and tongue normal; teeth and gums normal  Neck:   Supple, symmetrical, trachea midline, no adenopathy;    thyroid:  no enlargement/tenderness/nodules  Back:     Symmetric, no curvature, ROM normal, no CVA tenderness  Lungs:     respirations unlabored  Chest Wall:    No tenderness or deformity   Heart:    Regular rate and rhythm  Breast Exam:    No tenderness, masses, or nipple abnormality  Abdomen:     Soft, non-tender, bowel sounds  active all four quadrants,    no masses, no organomegaly  Genitalia:    Normal female without lesion, discharge or tenderness     Extremities:   Extremities normal, atraumatic, no cyanosis or edema  Pulses:   2+ and symmetric all extremities  Skin:   Skin color, texture, turgor normal, no rashes or lesions    Assessment:    Healthy female exam.   STI screen H/o hrHPV  Acne  Plan:   F/u PAP and cx Refilled Junel Refilled Benzole peroxide and hydrocortisone for acne Begin mammogram next year.   Rajendra Spiller L. Harraway-Smith, M.D., [name]

## 2019-09-20 NOTE — Addendum Note (Signed)
Addended by: Phill Myron on: 09/20/2019 08:51 AM   Modules accepted: Orders

## 2019-09-21 LAB — HEPATITIS B SURFACE ANTIGEN: Hepatitis B Surface Ag: NEGATIVE

## 2019-09-21 LAB — LIPID PANEL
Chol/HDL Ratio: 3.5 ratio (ref 0.0–4.4)
Cholesterol, Total: 262 mg/dL — ABNORMAL HIGH (ref 100–199)
HDL: 75 mg/dL (ref >39)
LDL Chol Calc (NIH): 170 mg/dL — ABNORMAL HIGH (ref 0–99)
Triglycerides: 99 mg/dL (ref 0–149)
VLDL Cholesterol Cal: 17 mg/dL (ref 5–40)

## 2019-09-21 LAB — RPR: RPR Ser Ql: NONREACTIVE

## 2019-09-21 LAB — HEPATITIS C ANTIBODY: Hep C Virus Ab: <0.1 s/co ratio (ref 0.0–0.9)

## 2019-09-21 LAB — COMPREHENSIVE METABOLIC PANEL
ALT: 14 IU/L (ref 0–32)
AST: 15 IU/L (ref 0–40)
Albumin/Globulin Ratio: 2 (ref 1.2–2.2)
Albumin: 4.9 g/dL — ABNORMAL HIGH (ref 3.8–4.8)
Alkaline Phosphatase: 52 IU/L (ref 39–117)
BUN/Creatinine Ratio: 16 (ref 9–23)
BUN: 12 mg/dL (ref 6–20)
Bilirubin Total: 0.4 mg/dL (ref 0.0–1.2)
CO2: 25 mmol/L (ref 20–29)
Calcium: 9.7 mg/dL (ref 8.7–10.2)
Chloride: 102 mmol/L (ref 96–106)
Creatinine, Ser: 0.73 mg/dL (ref 0.57–1.00)
GFR calc Af Amer: 120 mL/min/{1.73_m2} (ref >59)
GFR calc non Af Amer: 104 mL/min/{1.73_m2} (ref >59)
Globulin, Total: 2.5 g/dL (ref 1.5–4.5)
Glucose: 98 mg/dL (ref 65–99)
Potassium: 4.1 mmol/L (ref 3.5–5.2)
Sodium: 139 mmol/L (ref 134–144)
Total Protein: 7.4 g/dL (ref 6.0–8.5)

## 2019-09-21 LAB — HIV ANTIBODY (ROUTINE TESTING W REFLEX): HIV Screen 4th Generation wRfx: NONREACTIVE

## 2019-09-21 LAB — VITAMIN D 25 HYDROXY (VIT D DEFICIENCY, FRACTURES): Vit D, 25-Hydroxy: 28.7 ng/mL — ABNORMAL LOW (ref 30.0–100.0)

## 2019-09-22 LAB — CYTOLOGY - PAP
Chlamydia: NEGATIVE
Comment: NEGATIVE
Comment: NEGATIVE
Comment: NEGATIVE
Comment: NORMAL
Diagnosis: NEGATIVE
High risk HPV: NEGATIVE
Neisseria Gonorrhea: NEGATIVE
Trichomonas: NEGATIVE

## 2020-10-23 ENCOUNTER — Other Ambulatory Visit (HOSPITAL_COMMUNITY)
Admission: RE | Admit: 2020-10-23 | Discharge: 2020-10-23 | Disposition: A | Discharge status: Home / Self Care | Payer: BC Managed Care – PPO | Source: Ambulatory Visit | Attending: Obstetrics & Gynecology | Admitting: Obstetrics & Gynecology

## 2020-10-23 ENCOUNTER — Other Ambulatory Visit: Payer: Self-pay

## 2020-10-23 ENCOUNTER — Ambulatory Visit (INDEPENDENT_AMBULATORY_CARE_PROVIDER_SITE_OTHER): Payer: BC Managed Care – PPO | Admitting: Obstetrics & Gynecology

## 2020-10-23 ENCOUNTER — Encounter: Payer: Self-pay | Admitting: Obstetrics & Gynecology

## 2020-10-23 VITALS — BP 136/86 | HR 78 | Ht 62.0 in | Wt 132.0 lb

## 2020-10-23 DIAGNOSIS — L709 Acne, unspecified: Secondary | ICD-10-CM | POA: Diagnosis not present

## 2020-10-23 DIAGNOSIS — Z01419 Encounter for gynecological examination (general) (routine) without abnormal findings: Secondary | ICD-10-CM

## 2020-10-23 DIAGNOSIS — L700 Acne vulgaris: Secondary | ICD-10-CM

## 2020-10-23 DIAGNOSIS — J302 Other seasonal allergic rhinitis: Secondary | ICD-10-CM

## 2020-10-23 DIAGNOSIS — R21 Rash and other nonspecific skin eruption: Secondary | ICD-10-CM

## 2020-10-23 DIAGNOSIS — Z3041 Encounter for surveillance of contraceptive pills: Secondary | ICD-10-CM

## 2020-10-23 DIAGNOSIS — Z113 Encounter for screening for infections with a predominantly sexual mode of transmission: Secondary | ICD-10-CM | POA: Diagnosis not present

## 2020-10-23 DIAGNOSIS — Z1231 Encounter for screening mammogram for malignant neoplasm of breast: Secondary | ICD-10-CM

## 2020-10-23 MED ORDER — HYDROCORTISONE 2.5 % EX CREA: TOPICAL_CREAM | Freq: Two times a day (BID) | CUTANEOUS | 3 refills | Status: DC

## 2020-10-23 MED ORDER — MONTELUKAST SODIUM 10 MG PO TABS: 10.0000 mg | ORAL_TABLET | Freq: Every day | ORAL | 4 refills | Status: DC

## 2020-10-23 MED ORDER — BENZOYL PEROXIDE-ERYTHROMYCIN 5-3 % EX GEL: Freq: Two times a day (BID) | CUTANEOUS | 6 refills | Status: DC

## 2020-10-23 MED ORDER — NORETHIN ACE-ETH ESTRAD-FE 1-20 MG-MCG PO TABS: 1.0000 | ORAL_TABLET | Freq: Every day | ORAL | 4 refills | Status: DC

## 2020-10-23 NOTE — Progress Notes (Signed)
Subjective:     Sherry Sosa is a 39 y.o. female here for a routine exam.  Current complaints: Pt with no new complaints. She wants to be screening for STIs and HPV. She would like her meds refilled for the year. She cont on OCPs and has reg cycles. She denies new sexual partners.   2 days ago she injured her back picking up a load of laudry. She is managing it at home.      Gynecologic History Patient's last menstrual period was 10/02/2020 (exact date). Contraception: OCP (estrogen/progesterone) Last Pap: 2020. Results were: normal Last mammogram: n/a.   Obstetric History OB History  Gravida Para Term Preterm AB Living  0 0 0 0 0 0  SAB TAB Ectopic Multiple Live Births  0 0 0 0     The following portions of the patient's history were reviewed and updated as appropriate: allergies, current medications, past family history, past medical history, past social history, past surgical history and problem list.  Review of Systems Pertinent items are noted in HPI.    Objective:  BP 136/86   Pulse 78   Ht 5\' 2"  (1.575 m)   Wt 132 lb (59.9 kg)   LMP 10/02/2020 (Exact Date)   BMI 24.14 kg/m   General Appearance:    Alert, cooperative, no distress, appears stated age  Head:    Normocephalic, without obvious abnormality, atraumatic  Eyes:    conjunctiva/corneas clear, EOM's intact, both eyes  Ears:    Normal external ear canals, both ears  Nose:   Nares normal, septum midline, mucosa normal, no drainage    or sinus tenderness  Throat:   Lips, mucosa, and tongue normal; teeth and gums normal  Neck:   Supple, symmetrical, trachea midline, no adenopathy;    thyroid:  no enlargement/tenderness/nodules  Back:     Symmetric, no curvature, ROM normal, no CVA tenderness  Lungs:     respirations unlabored  Chest Wall:    No tenderness or deformity   Heart:    Regular rate and rhythm  Breast Exam:    No tenderness, masses, or nipple abnormality  Abdomen:     Soft, non-tender, bowel sounds  active all four quadrants,    no masses, no organomegaly  Genitalia:    Normal female without lesion, discharge or tenderness     Extremities:   Extremities normal, atraumatic, no cyanosis or edema  Pulses:   2+ and symmetric all extremities  Skin:   Skin color, texture, turgor normal, no rashes or lesions    Assessment:    Healthy female exam.    Plan:  Jesiah was seen today for annual exam.  Diagnoses and all orders for this visit:  Well female exam with routine gynecological exam -     Cytology - PAP( [place])  Routine screening for STI (sexually transmitted infection) -     Cytology - PAP( [place]) -     HIV antibody (with reflex) -     Hepatitis B Surface AntiGEN -     Hepatitis C Antibody -     RPR  Encounter for screening mammogram for malignant neoplasm of breast -     MM DIGITAL SCREENING BILATERAL; Future  Acne, unspecified acne type -     benzoyl peroxide-erythromycin (BENZAMYCIN) gel; Apply topically 2 (two) times daily.  Acne vulgaris -     benzoyl peroxide-erythromycin (BENZAMYCIN) gel; Apply topically 2 (two) times daily.  Rash of body -  hydrocortisone 2.5 % cream; Apply topically 2 (two) times daily.  Seasonal allergies -     montelukast (SINGULAIR) 10 MG tablet; Take 1 tablet (10 mg total) by mouth at bedtime.  Encounter for surveillance of contraceptive pills -     norethindrone-ethinyl estradiol (LOESTRIN FE) 1-20 MG-MCG tablet; Take 1 tablet by mouth daily.  Syrenity Klepacki L. Harraway-Smith, M.D., [facility]

## 2020-10-24 LAB — HEPATITIS B SURFACE ANTIGEN: Hepatitis B Surface Ag: NEGATIVE

## 2020-10-24 LAB — HIV ANTIBODY (ROUTINE TESTING W REFLEX): HIV Screen 4th Generation wRfx: NONREACTIVE

## 2020-10-24 LAB — RPR: RPR Ser Ql: NONREACTIVE

## 2020-10-24 LAB — HEPATITIS C ANTIBODY: Hep C Virus Ab: <0.1 s/co ratio (ref 0.0–0.9)

## 2020-10-25 LAB — CYTOLOGY - PAP
Chlamydia: NEGATIVE
Comment: NEGATIVE
Comment: NEGATIVE
Comment: NORMAL
Diagnosis: NEGATIVE
High risk HPV: NEGATIVE
Neisseria Gonorrhea: NEGATIVE

## 2020-12-19 ENCOUNTER — Ambulatory Visit: Payer: BC Managed Care – PPO

## 2021-09-04 ENCOUNTER — Other Ambulatory Visit: Payer: Self-pay

## 2021-09-04 DIAGNOSIS — Z3041 Encounter for surveillance of contraceptive pills: Secondary | ICD-10-CM

## 2021-09-04 MED ORDER — NORETHIN ACE-ETH ESTRAD-FE 1-20 MG-MCG PO TABS: 1.0000 | ORAL_TABLET | Freq: Every day | ORAL | 10 refills | Status: DC

## 2021-09-04 NOTE — Telephone Encounter (Signed)
Refill request for birth control. Armandina Stammer RN

## 2021-11-05 ENCOUNTER — Ambulatory Visit (INDEPENDENT_AMBULATORY_CARE_PROVIDER_SITE_OTHER): Payer: BC Managed Care – PPO | Admitting: Obstetrics & Gynecology

## 2021-11-05 ENCOUNTER — Encounter: Payer: Self-pay | Admitting: Obstetrics & Gynecology

## 2021-11-05 ENCOUNTER — Other Ambulatory Visit (HOSPITAL_COMMUNITY)
Admission: RE | Admit: 2021-11-05 | Discharge: 2021-11-05 | Disposition: A | Discharge status: Home / Self Care | Payer: BC Managed Care – PPO | Source: Ambulatory Visit | Attending: Obstetrics & Gynecology | Admitting: Obstetrics & Gynecology

## 2021-11-05 ENCOUNTER — Other Ambulatory Visit: Payer: Self-pay

## 2021-11-05 VITALS — BP 131/87 | HR 78 | Ht 62.0 in | Wt 128.0 lb

## 2021-11-05 DIAGNOSIS — R7989 Other specified abnormal findings of blood chemistry: Secondary | ICD-10-CM | POA: Diagnosis not present

## 2021-11-05 DIAGNOSIS — Z113 Encounter for screening for infections with a predominantly sexual mode of transmission: Secondary | ICD-10-CM

## 2021-11-05 DIAGNOSIS — Z1231 Encounter for screening mammogram for malignant neoplasm of breast: Secondary | ICD-10-CM

## 2021-11-05 DIAGNOSIS — J302 Other seasonal allergic rhinitis: Secondary | ICD-10-CM | POA: Diagnosis not present

## 2021-11-05 DIAGNOSIS — L709 Acne, unspecified: Secondary | ICD-10-CM

## 2021-11-05 DIAGNOSIS — E7800 Pure hypercholesterolemia, unspecified: Secondary | ICD-10-CM

## 2021-11-05 DIAGNOSIS — Z3041 Encounter for surveillance of contraceptive pills: Secondary | ICD-10-CM

## 2021-11-05 DIAGNOSIS — R21 Rash and other nonspecific skin eruption: Secondary | ICD-10-CM | POA: Diagnosis not present

## 2021-11-05 DIAGNOSIS — R031 Nonspecific low blood-pressure reading: Secondary | ICD-10-CM

## 2021-11-05 DIAGNOSIS — Z01419 Encounter for gynecological examination (general) (routine) without abnormal findings: Secondary | ICD-10-CM

## 2021-11-05 DIAGNOSIS — L700 Acne vulgaris: Secondary | ICD-10-CM

## 2021-11-05 MED ORDER — MONTELUKAST SODIUM 10 MG PO TABS: 10.0000 mg | ORAL_TABLET | Freq: Every day | ORAL | 4 refills | Status: DC

## 2021-11-05 MED ORDER — HYDROCORTISONE 2.5 % EX CREA: TOPICAL_CREAM | Freq: Two times a day (BID) | CUTANEOUS | 3 refills | Status: DC

## 2021-11-05 MED ORDER — BENZOYL PEROXIDE-ERYTHROMYCIN 5-3 % EX GEL: Freq: Two times a day (BID) | CUTANEOUS | 6 refills | Status: DC

## 2021-11-05 MED ORDER — NORETHIN ACE-ETH ESTRAD-FE 1-20 MG-MCG PO TABS: 1.0000 | ORAL_TABLET | Freq: Every day | ORAL | 10 refills | Status: DC

## 2021-11-05 NOTE — Progress Notes (Signed)
Subjective:     Sherry Sosa is a 40 y.o. female here for a routine exam.  Current complaints: none. Pt reports taking her OCPs daily. She is currently sexually active. Monogamous. Requests STI screen.  Reports that she is taking Vit D. Her lipids were elevated prev and she'd like them repeated. Pt does not have a separate primary care provider. She comes to [place] for primary care. No changes in FH. No new medical issues or concerns.        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    Gynecologic History Patient's last menstrual period was 10/26/2021. Contraception: OCP (estrogen/progesterone) Last Pap: 10/23/2020. Results were: normal h/o pos high risk HPV  Last mammogram: n/a.   Obstetric History OB History  Gravida Para Term Preterm AB Living  0 0 0 0 0 0  SAB IAB Ectopic Multiple Live Births  0 0 0 0     The following portions of the patient's history were reviewed and updated as appropriate: allergies, current medications, past family history, past medical history, past social history, past surgical history, and problem list.  Review of Systems Pertinent items are noted in HPI.    Objective:  BP 131/87    Pulse 78    Ht 5\' 2"  (1.575 m)    Wt 128 lb (58.1 kg)    LMP 10/26/2021    BMI 23.41 kg/m  General Appearance:    Alert, cooperative, no distress, appears stated age  Head:    Normocephalic, without obvious abnormality, atraumatic  Eyes:    conjunctiva/corneas clear, EOM's intact, both eyes  Ears:    Normal external ear canals, both ears  Nose:   Nares normal, septum midline, mucosa normal, no drainage    or sinus tenderness  Throat:   Lips, mucosa, and tongue normal; teeth and gums normal  Neck:   Supple, symmetrical, trachea midline, no adenopathy;    thyroid:  no enlargement/tenderness/nodules  Back:     Symmetric, no curvature, ROM normal, no CVA tenderness  Lungs:     respirations unlabored  Chest Wall:    No tenderness or deformity   Heart:    Regular rate and  rhythm  Breast Exam:    No tenderness, masses, or nipple abnormality  Abdomen:     Soft, non-tender, bowel sounds active all four quadrants,    no masses, no organomegaly  Genitalia:    Normal female without lesion, discharge or tenderness   Adnexa without masses; uterus small and mobile   Extremities:   Extremities normal, atraumatic, no cyanosis or edema  Pulses:   2+ and symmetric all extremities  Skin:   Skin color, texture, turgor normal, no rashes or lesions     Assessment:    Healthy female exam.    Plan:  Diagnoses and all orders for this visit:  Well female exam with routine gynecological exam -     Cytology - PAP( [place]) -     TSH -     Hemoglobin A1c -     CBC -     Comprehensive metabolic panel  Seasonal allergies -     montelukast (SINGULAIR) 10 MG tablet; Take 1 tablet (10 mg total) by mouth at bedtime.  Encounter for surveillance of contraceptive pills -     norethindrone-ethinyl estradiol-FE (LOESTRIN FE) 1-20 MG-MCG tablet; Take 1 tablet by mouth daily.  Rash of body -     hydrocortisone 2.5 % cream; Apply topically 2 (two) times  daily.  Acne, unspecified acne type -     benzoyl peroxide-erythromycin (BENZAMYCIN) gel; Apply topically 2 (two) times daily.  Acne vulgaris -     benzoyl peroxide-erythromycin (BENZAMYCIN) gel; Apply topically 2 (two) times daily.  Breast cancer screening by mammogram -     MM Digital Screening; Future  Routine screening for STI (sexually transmitted infection) -     RPR -     HIV antibody (with reflex) -     Hepatitis B Surface AntiGEN -     Hepatitis C Antibody  Low vitamin D level -     Vitamin D 1,25 dihydroxy  Elevated cholesterol -     Lipid panel  Borderline low blood pressure determined by examination   Pt to check BPs at home. She will call us if there is a change in BPs   F/u in 1 year or sooner prn   Zoe Goonan L. Harraway-Smith, M.D., [place]

## 2021-11-06 LAB — LIPID PANEL
Chol/HDL Ratio: 4 ratio (ref 0.0–4.4)
Cholesterol, Total: 307 mg/dL — ABNORMAL HIGH (ref 100–199)
HDL: 76 mg/dL (ref >39)
LDL Chol Calc (NIH): 204 mg/dL — ABNORMAL HIGH (ref 0–99)
Triglycerides: 151 mg/dL — ABNORMAL HIGH (ref 0–149)
VLDL Cholesterol Cal: 27 mg/dL (ref 5–40)

## 2021-11-07 ENCOUNTER — Telehealth (HOSPITAL_BASED_OUTPATIENT_CLINIC_OR_DEPARTMENT_OTHER): Payer: Self-pay

## 2021-11-09 LAB — CYTOLOGY - PAP
Chlamydia: NEGATIVE
Comment: NEGATIVE
Comment: NEGATIVE
Comment: NORMAL
Diagnosis: UNDETERMINED — AB
High risk HPV: NEGATIVE
Neisseria Gonorrhea: NEGATIVE

## 2021-11-16 ENCOUNTER — Telehealth: Payer: Self-pay

## 2021-11-16 LAB — COMPREHENSIVE METABOLIC PANEL
ALT: 15 IU/L (ref 0–32)
AST: 19 IU/L (ref 0–40)
Albumin/Globulin Ratio: 2.5 — ABNORMAL HIGH (ref 1.2–2.2)
Albumin: 4.9 g/dL — ABNORMAL HIGH (ref 3.8–4.8)
Alkaline Phosphatase: 50 IU/L (ref 44–121)
BUN/Creatinine Ratio: 13 (ref 9–23)
BUN: 10 mg/dL (ref 6–24)
Bilirubin Total: 0.4 mg/dL (ref 0.0–1.2)
CO2: 25 mmol/L (ref 20–29)
Calcium: 9.8 mg/dL (ref 8.7–10.2)
Chloride: 101 mmol/L (ref 96–106)
Creatinine, Ser: 0.78 mg/dL (ref 0.57–1.00)
Globulin, Total: 2 g/dL (ref 1.5–4.5)
Glucose: 93 mg/dL (ref 70–99)
Potassium: 4.5 mmol/L (ref 3.5–5.2)
Sodium: 139 mmol/L (ref 134–144)
Total Protein: 6.9 g/dL (ref 6.0–8.5)
eGFR: 98 mL/min/{1.73_m2} (ref >59)

## 2021-11-16 LAB — VITAMIN D 1,25 DIHYDROXY
Vitamin D 1, 25 (OH)2 Total: 55 pg/mL
Vitamin D2 1, 25 (OH)2: <10 pg/mL
Vitamin D3 1, 25 (OH)2: 53 pg/mL

## 2021-11-16 LAB — HEPATITIS B SURFACE ANTIGEN: Hepatitis B Surface Ag: NEGATIVE

## 2021-11-16 LAB — HIV ANTIBODY (ROUTINE TESTING W REFLEX): HIV Screen 4th Generation wRfx: NONREACTIVE

## 2021-11-16 LAB — HEMOGLOBIN A1C
Est. average glucose Bld gHb Est-mCnc: 126 mg/dL
Hgb A1c MFr Bld: 6 % — ABNORMAL HIGH (ref 4.8–5.6)

## 2021-11-16 LAB — CBC
Hematocrit: 41.1 % (ref 34.0–46.6)
Hemoglobin: 13.7 g/dL (ref 11.1–15.9)
MCH: 30.9 pg (ref 26.6–33.0)
MCHC: 33.3 g/dL (ref 31.5–35.7)
MCV: 93 fL (ref 79–97)
Platelets: 368 10*3/uL (ref 150–450)
RBC: 4.44 x10E6/uL (ref 3.77–5.28)
RDW: 11.8 % (ref 11.7–15.4)
WBC: 7 10*3/uL (ref 3.4–10.8)

## 2021-11-16 LAB — RPR: RPR Ser Ql: NONREACTIVE

## 2021-11-16 LAB — TSH: TSH: 2.09 u[IU]/mL (ref 0.450–4.500)

## 2021-11-16 LAB — HEPATITIS C ANTIBODY: Hep C Virus Ab: 0.1 s/co ratio (ref 0.0–0.9)

## 2021-11-16 NOTE — Telephone Encounter (Signed)
Called pt to inform her of elevated Cholesterol and to discuss Pap smear. Pt made aware that she will need to f/u with a PCP for her Cholesterol, pt was given the number to [facility]. Pt also made aware that her Pap was negative HPV but positive ASCUS and she will need to repeat her pap in 1 year. Understanding was voiced. Rashi Granier l Gwendolyn Nishi, CMA

## 2022-05-08 ENCOUNTER — Encounter: Payer: Self-pay | Admitting: Obstetrics & Gynecology

## 2022-05-08 ENCOUNTER — Ambulatory Visit (HOSPITAL_BASED_OUTPATIENT_CLINIC_OR_DEPARTMENT_OTHER)
Admission: RE | Admit: 2022-05-08 | Discharge: 2022-05-08 | Disposition: A | Discharge status: Home / Self Care | Payer: BC Managed Care – PPO | Source: Ambulatory Visit | Attending: Obstetrics & Gynecology | Admitting: Obstetrics & Gynecology

## 2022-05-08 ENCOUNTER — Other Ambulatory Visit: Payer: Self-pay | Admitting: Obstetrics & Gynecology

## 2022-05-08 ENCOUNTER — Ambulatory Visit (INDEPENDENT_AMBULATORY_CARE_PROVIDER_SITE_OTHER): Payer: BC Managed Care – PPO | Admitting: Obstetrics & Gynecology

## 2022-05-08 ENCOUNTER — Encounter (HOSPITAL_BASED_OUTPATIENT_CLINIC_OR_DEPARTMENT_OTHER): Payer: Self-pay

## 2022-05-08 VITALS — BP 143/97 | HR 90 | Wt 133.0 lb

## 2022-05-08 DIAGNOSIS — N949 Unspecified condition associated with female genital organs and menstrual cycle: Secondary | ICD-10-CM

## 2022-05-08 DIAGNOSIS — R102 Pelvic and perineal pain: Secondary | ICD-10-CM

## 2022-05-08 DIAGNOSIS — D252 Subserosal leiomyoma of uterus: Secondary | ICD-10-CM | POA: Diagnosis not present

## 2022-05-08 DIAGNOSIS — R6882 Decreased libido: Secondary | ICD-10-CM

## 2022-05-08 DIAGNOSIS — N939 Abnormal uterine and vaginal bleeding, unspecified: Secondary | ICD-10-CM

## 2022-05-08 NOTE — Progress Notes (Signed)
History:  41 y.o. G0P0000 here today for eval of Bartholin's gland. She reports that she had similar pain in 2017 and is worried that its her Bartholin's. She reports pain that extends to her back and lower Pt is s/p removal of Left Bartholin cyst in 2017. Pt reports pain extends from her back down her thigh. She reports that it is actually improved today over subsequent days. She reports some pressure in the pelvis which is why she thought it was related to a Bartholin's. She denies abnormal discharge. She does report irreg bleeding and for the past several months, she has had difficulty with having an orgasm. She reports that it takes a long time to get aroused. She denies new meds. No life stressors. She denies issues with her partner but, reports that this issues is bothering him.  Pt hs STI screening in Dec and does not feel that it is needed. She does report that she had some Keflex at home so she took some of it.       The following portions of the patient's history were reviewed and updated as appropriate: allergies, current medications, past family history, past medical history, past social history, past surgical history and problem list.  Review of Systems:  Pertinent items are noted in HPI.    Objective:  Physical Exam Blood pressure (!) 143/97, pulse 90, weight 133 lb (60.3 kg), last menstrual period 04/29/2022.  CONSTITUTIONAL: Well-developed, well-nourished female in no acute distress.  HENT:  Normocephalic, atraumatic EYES: Conjunctivae and EOM are normal. No scleral icterus.  NECK: Normal range of motion SKIN: Skin is warm and dry. No rash noted. Not diaphoretic.No pallor. NEUROLGIC: Alert and oriented to person, place, and time. Normal coordination.  Abd: Soft, nontender and nondistended Pelvic: Normal appearing external genitalia; normal appearing vaginal mucosa and cervix.  Discharge reveal old blood. Small uterus, there is tenderness of the left adnexa. There is fullness  however this could be due to voluntary guarding. No palpable masses noted.    Assessment & Plan:  Diagnoses and all orders for this visit:  Pelvic pain in female -     US PELVIS (TRANSABDOMINAL ONLY); Future -     US PELVIS TRANSVAGINAL NON-OB (TV ONLY); Future  Adnexal fullness -     US PELVIS (TRANSABDOMINAL ONLY); Future -     US PELVIS TRANSVAGINAL NON-OB (TV ONLY); Future  Abnormal uterine bleeding (AUB)  Decreased libido  Rec f/u after [place].   Ying Blankenhorn L. Harraway-Smith, M.D., [facility]

## 2022-05-10 ENCOUNTER — Encounter: Payer: Self-pay | Admitting: Obstetrics & Gynecology

## 2022-06-24 ENCOUNTER — Other Ambulatory Visit: Payer: Self-pay

## 2022-06-24 DIAGNOSIS — Z3041 Encounter for surveillance of contraceptive pills: Secondary | ICD-10-CM

## 2022-06-24 MED ORDER — NORETHIN ACE-ETH ESTRAD-FE 1-20 MG-MCG PO TABS: 1.0000 | ORAL_TABLET | Freq: Every day | ORAL | 3 refills | Status: DC

## 2022-09-19 ENCOUNTER — Other Ambulatory Visit: Payer: Self-pay

## 2022-09-19 DIAGNOSIS — Z1231 Encounter for screening mammogram for malignant neoplasm of breast: Secondary | ICD-10-CM

## 2022-11-06 ENCOUNTER — Ambulatory Visit (HOSPITAL_BASED_OUTPATIENT_CLINIC_OR_DEPARTMENT_OTHER)
Admission: RE | Admit: 2022-11-06 | Discharge: 2022-11-06 | Disposition: A | Discharge status: Home / Self Care | Payer: BC Managed Care – PPO | Source: Ambulatory Visit | Attending: Obstetrics & Gynecology | Admitting: Obstetrics & Gynecology

## 2022-11-06 ENCOUNTER — Encounter (HOSPITAL_BASED_OUTPATIENT_CLINIC_OR_DEPARTMENT_OTHER): Payer: Self-pay

## 2022-11-06 ENCOUNTER — Encounter: Payer: Self-pay | Admitting: Obstetrics & Gynecology

## 2022-11-06 ENCOUNTER — Other Ambulatory Visit (HOSPITAL_COMMUNITY)
Admission: RE | Admit: 2022-11-06 | Discharge: 2022-11-06 | Disposition: A | Discharge status: Home / Self Care | Payer: BC Managed Care – PPO | Source: Ambulatory Visit | Attending: Obstetrics & Gynecology | Admitting: Obstetrics & Gynecology

## 2022-11-06 ENCOUNTER — Ambulatory Visit (INDEPENDENT_AMBULATORY_CARE_PROVIDER_SITE_OTHER): Payer: BC Managed Care – PPO | Admitting: Obstetrics & Gynecology

## 2022-11-06 ENCOUNTER — Inpatient Hospital Stay (HOSPITAL_BASED_OUTPATIENT_CLINIC_OR_DEPARTMENT_OTHER): Admission: RE | Admit: 2022-11-06 | Payer: BC Managed Care – PPO | Source: Ambulatory Visit

## 2022-11-06 VITALS — BP 130/85 | HR 71 | Ht 62.0 in | Wt 131.0 lb

## 2022-11-06 DIAGNOSIS — Z01419 Encounter for gynecological examination (general) (routine) without abnormal findings: Secondary | ICD-10-CM

## 2022-11-06 DIAGNOSIS — R87610 Atypical squamous cells of undetermined significance on cytologic smear of cervix (ASC-US): Secondary | ICD-10-CM

## 2022-11-06 DIAGNOSIS — Z1231 Encounter for screening mammogram for malignant neoplasm of breast: Secondary | ICD-10-CM | POA: Insufficient documentation

## 2022-11-06 DIAGNOSIS — R21 Rash and other nonspecific skin eruption: Secondary | ICD-10-CM

## 2022-11-06 DIAGNOSIS — J302 Other seasonal allergic rhinitis: Secondary | ICD-10-CM

## 2022-11-06 DIAGNOSIS — E785 Hyperlipidemia, unspecified: Secondary | ICD-10-CM

## 2022-11-06 DIAGNOSIS — J4520 Mild intermittent asthma, uncomplicated: Secondary | ICD-10-CM | POA: Diagnosis not present

## 2022-11-06 DIAGNOSIS — Z113 Encounter for screening for infections with a predominantly sexual mode of transmission: Secondary | ICD-10-CM | POA: Diagnosis not present

## 2022-11-06 DIAGNOSIS — L709 Acne, unspecified: Secondary | ICD-10-CM

## 2022-11-06 DIAGNOSIS — L700 Acne vulgaris: Secondary | ICD-10-CM

## 2022-11-06 DIAGNOSIS — Z3041 Encounter for surveillance of contraceptive pills: Secondary | ICD-10-CM

## 2022-11-06 MED ORDER — MONTELUKAST SODIUM 10 MG PO TABS: 10.0000 mg | ORAL_TABLET | Freq: Every day | ORAL | 4 refills | Status: DC

## 2022-11-06 MED ORDER — NORETHIN ACE-ETH ESTRAD-FE 1-20 MG-MCG PO TABS: 1.0000 | ORAL_TABLET | Freq: Every day | ORAL | 4 refills | Status: DC

## 2022-11-06 MED ORDER — BENZOYL PEROXIDE-ERYTHROMYCIN 5-3 % EX GEL: Freq: Two times a day (BID) | CUTANEOUS | 6 refills | Status: DC

## 2022-11-06 MED ORDER — TRETINOIN 0.025 % EX CREA: TOPICAL_CREAM | Freq: Every day | CUTANEOUS | 0 refills | Status: DC

## 2022-11-06 MED ORDER — HYDROCORTISONE 2.5 % EX CREA: TOPICAL_CREAM | Freq: Two times a day (BID) | CUTANEOUS | 3 refills | Status: DC

## 2022-11-06 NOTE — Progress Notes (Signed)
Patient request year refills on birth control (junel DAW) montelukast, hydrocortinsone, benzamycin. Armandina Stammer RN

## 2022-11-06 NOTE — Progress Notes (Signed)
Subjective:     Sherry Sosa is a 41 y.o. female here for a routine exam.  Current complaints: Pt is doing well. Has concerns about prevention of the effects of aging. She wants to begin Tretinoin. She also had questions about collagen.   Pt with h/o elevated lipid panel. Not fasting at present.   Pt reports that she is getting occ sores (small bumps on her tongue.       Gynecologic History Patient's last menstrual period was 10/17/2022 (exact date). Contraception: OCP (estrogen/progesterone) Last Pap: 11/05/2021. Results were:  High risk HPV Negative  Neisseria Gonorrhea Negative  Chlamydia Negative  Adequacy Satisfactory for evaluation; transformation zone component PRESENT.  Diagnosis - Atypical squamous cells of undetermined significance (ASC-US) Abnormal   Comment Normal Reference Range HPV - Negative  Comment Normal Reference [place] Chlamydia - Negative  Comment Normal Reference Range Neisseria Gonorrhea - Negative   Last mammogram: 11/06/2022. Results were: pending  Obstetric History OB History  Gravida Para Term Preterm AB Living  0 0 0 0 0 0  SAB IAB Ectopic Multiple Live Births  0 0 0 0      The following portions of the patient's history were reviewed and updated as appropriate: allergies, current medications, past family history, past medical history, past social history, past surgical history, and problem list.  Review of Systems Pertinent items are noted in HPI.    Objective:  BP 130/85   Pulse 71   Ht 5\' 2"  (1.575 m)   Wt 131 lb (59.4 kg)   LMP 10/17/2022 (Exact Date)   BMI 23.96 kg/m  General Appearance:    Alert, cooperative, no distress, appears stated age  Head:    Normocephalic, without obvious abnormality, atraumatic  Eyes:    conjunctiva/corneas clear, EOM's intact, both eyes  Ears:    Normal external ear canals, both ears  Nose:   Nares normal, septum midline, mucosa normal, no drainage    or sinus tenderness  Throat:   Lips and mucosa normal;  tongue has small comedone at the tip. Teeth and gums normal  Neck:   Supple, symmetrical, trachea midline, no adenopathy;    thyroid:  no enlargement/tenderness/nodules  Back:     Symmetric, no curvature, ROM normal, no CVA tenderness  Lungs:     respirations unlabored  Chest Wall:    No tenderness or deformity   Heart:    Regular rate and rhythm  Breast Exam:    No tenderness, masses, or nipple abnormality  Abdomen:     Soft, non-tender, bowel sounds active all four quadrants,    no masses, no organomegaly  Genitalia:    Normal female without lesion, discharge or tenderness     Extremities:   Extremities normal, atraumatic, no cyanosis or edema  Pulses:   2+ and symmetric all extremities  Skin:   Skin color, texture, turgor normal, no rashes or lesions     Assessment:    Healthy female exam.    Plan:  Diagnoses and all orders for this visit:  Well female exam with routine gynecological exam -     Cytology - PAP( [place]) -     Lipid panel -     CBC -     HIV antibody (with reflex) -     Hepatitis B Surface AntiGEN -     Hepatitis C Antibody -     HgB A1c -     TSH -     RPR  ASCUS  of cervix with negative high risk HPV -     Cytology - PAP( [place])  Mild intermittent asthma without complication  Screening for STD (sexually transmitted disease) -     HIV antibody (with reflex) -     Hepatitis B Surface AntiGEN -     Hepatitis C Antibody -     RPR  Elevated lipids  Acne vulgaris -     benzoyl peroxide-erythromycin (BENZAMYCIN) gel; Apply topically 2 (two) times daily.  Acne, unspecified acne type -     benzoyl peroxide-erythromycin (BENZAMYCIN) gel; Apply topically 2 (two) times daily. -     tretinoin (RETIN-A) 0.025 % cream; Apply topically at bedtime.  Seasonal allergies -     montelukast (SINGULAIR) 10 MG tablet; Take 1 tablet (10 mg total) by mouth at bedtime.  Encounter for surveillance of contraceptive pills -     norethindrone-ethinyl  estradiol-FE (LOESTRIN FE) 1-20 MG-MCG tablet; Take 1 tablet by mouth daily.  Rash of body -     hydrocortisone 2.5 % cream; Apply topically 2 (two) times daily.   OTC Zinc weekly. F/u in am for labs F/u in 1 year or sooner prn   Ilina Xu L. Harraway-Smith, M.D., [place] [facility]

## 2022-11-07 ENCOUNTER — Other Ambulatory Visit: Payer: BC Managed Care – PPO

## 2022-11-13 LAB — CYTOLOGY - PAP
Comment: NEGATIVE
Diagnosis: NEGATIVE
High risk HPV: NEGATIVE

## 2022-12-16 ENCOUNTER — Other Ambulatory Visit: Payer: Self-pay

## 2022-12-16 DIAGNOSIS — J302 Other seasonal allergic rhinitis: Secondary | ICD-10-CM

## 2022-12-16 MED ORDER — MONTELUKAST SODIUM 10 MG PO TABS: 10.0000 mg | ORAL_TABLET | Freq: Every day | ORAL | 4 refills | Status: DC

## 2023-04-25 IMAGING — US US PELVIS COMPLETE TRANSABD/TRANSVAG W DUPLEX AND/OR DOPPLER
1 series · 13 of 25 positions shown · non-contrast
Comparison: Pelvic ultrasound 08/23/2016

CLINICAL DATA: Pelvic pain with adnexal fullness

EXAM:
TRANSABDOMINAL AND TRANSVAGINAL ULTRASOUND OF PELVIS
DOPPLER ULTRASOUND OF OVARIES
TECHNIQUE: Both transabdominal and transvaginal ultrasound examinations of the
pelvis were performed. Transabdominal technique was performed for
global imaging of the pelvis including uterus, ovaries, adnexal
regions, and pelvic cul-de-sac.
It was necessary to proceed with endovaginal exam following the
transabdominal exam to visualize the uterus endometrium ovaries.
Color and duplex Doppler ultrasound was utilized to evaluate blood
flow to the ovaries.

[Series 1: us pelvis complete transabd/transvag w duplex and/ · 13 of 122 slices shown]
[im 1/122]
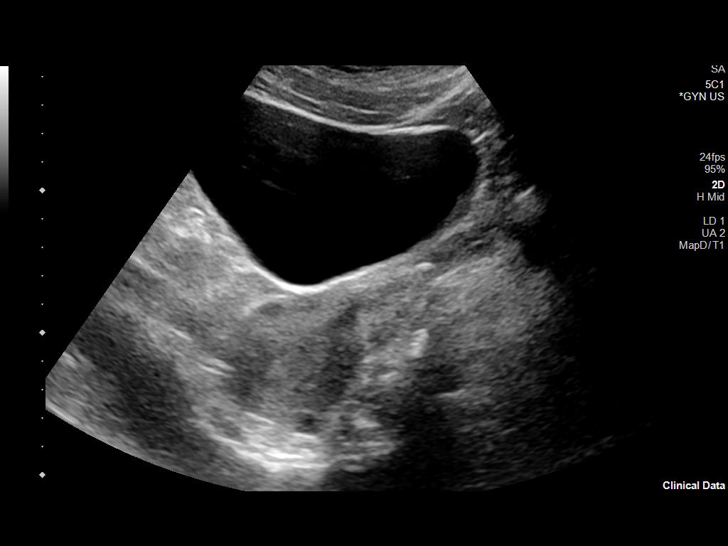
[im 11/122]
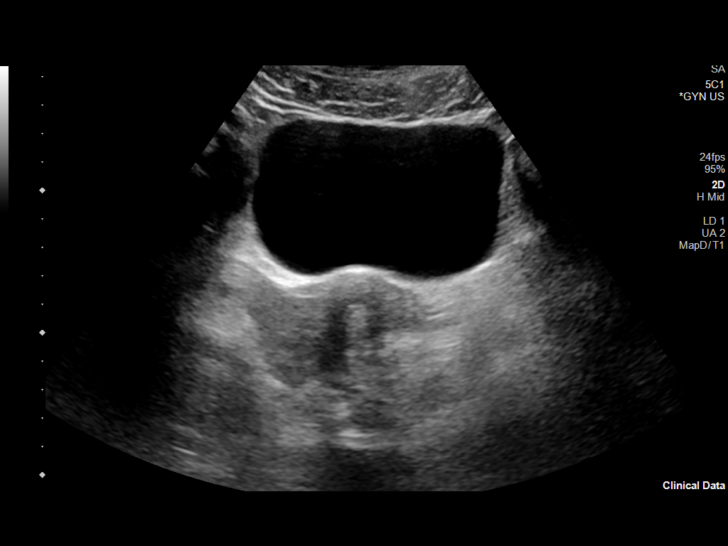
[im 21/122]
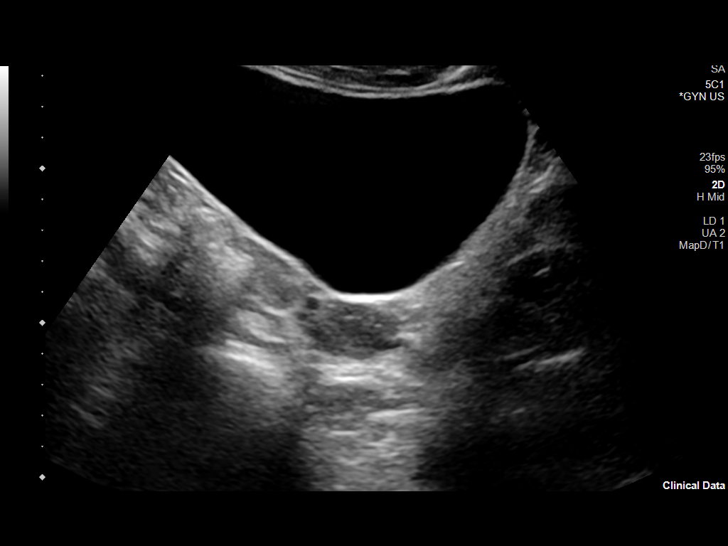
[im 31/122]
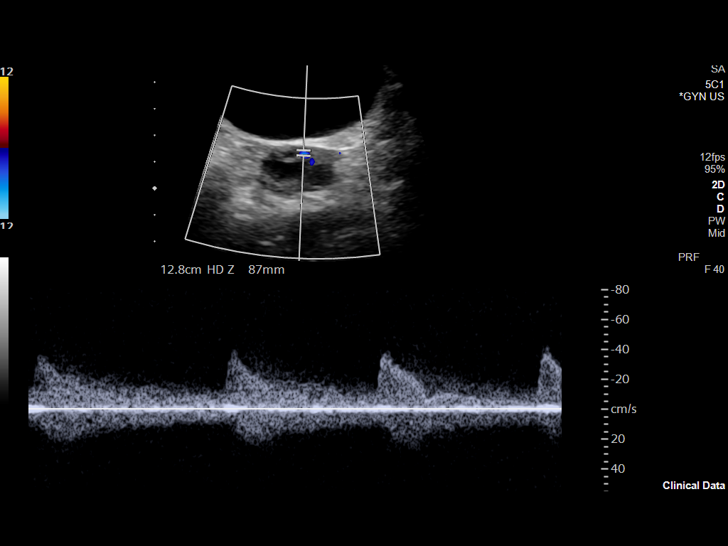
[im 41/122]
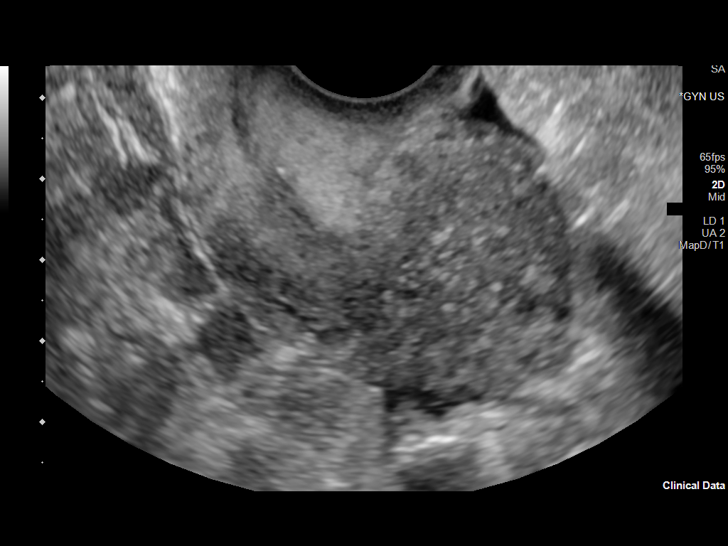
[im 51/122]
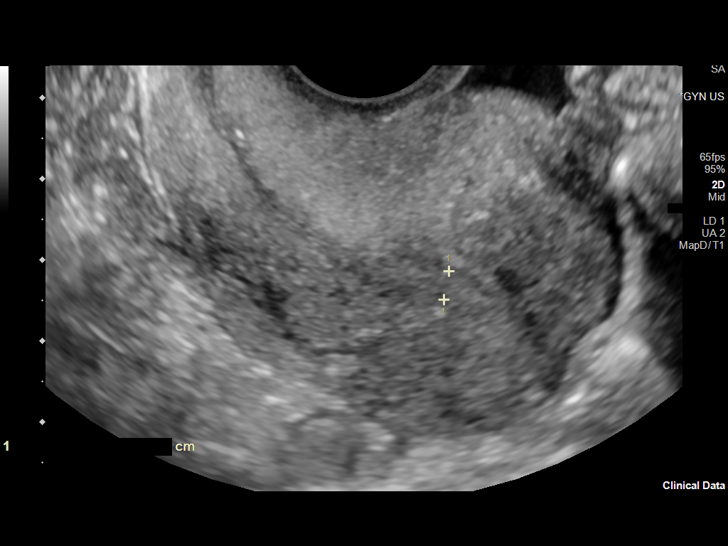
[im 61/122]
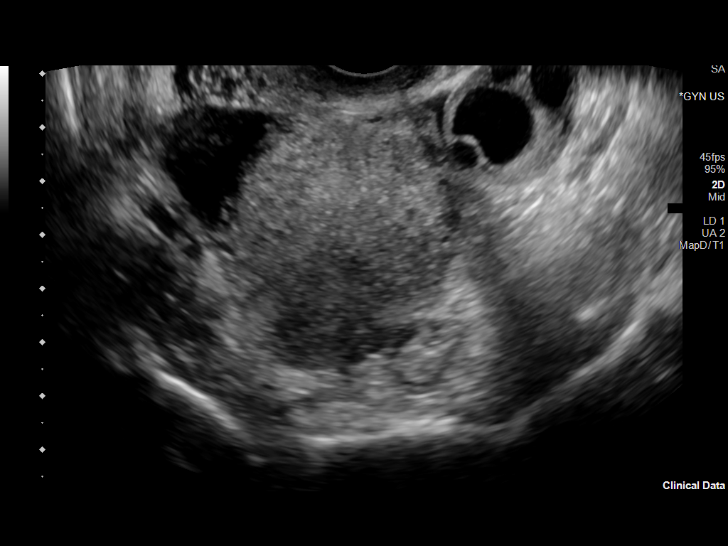
[im 71/122]
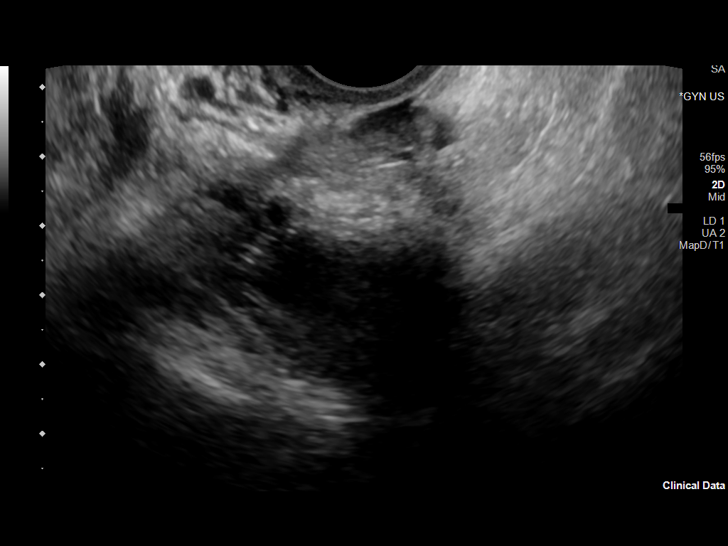
[im 81/122]
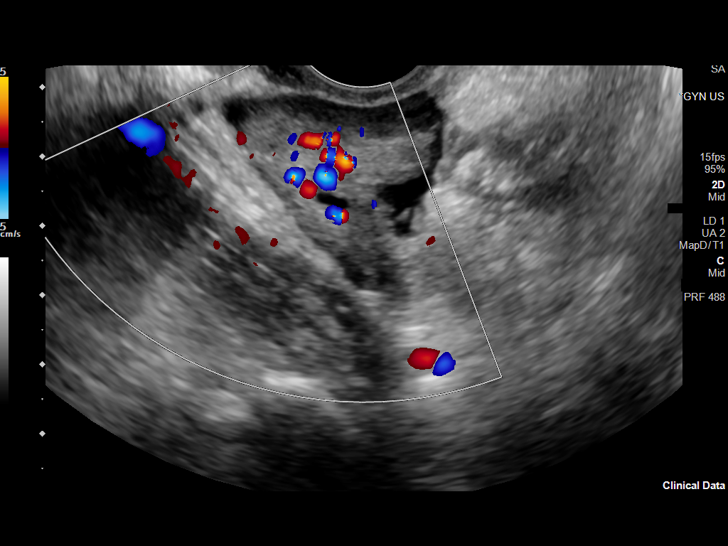
[im 91/122]
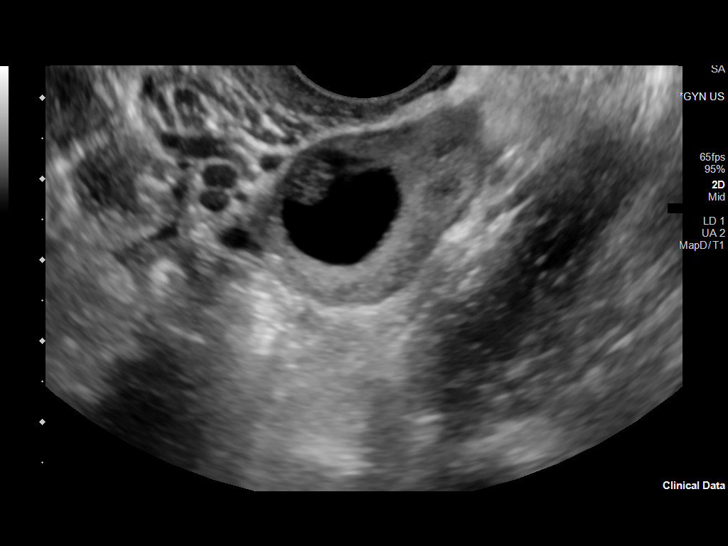
[im 101/122]
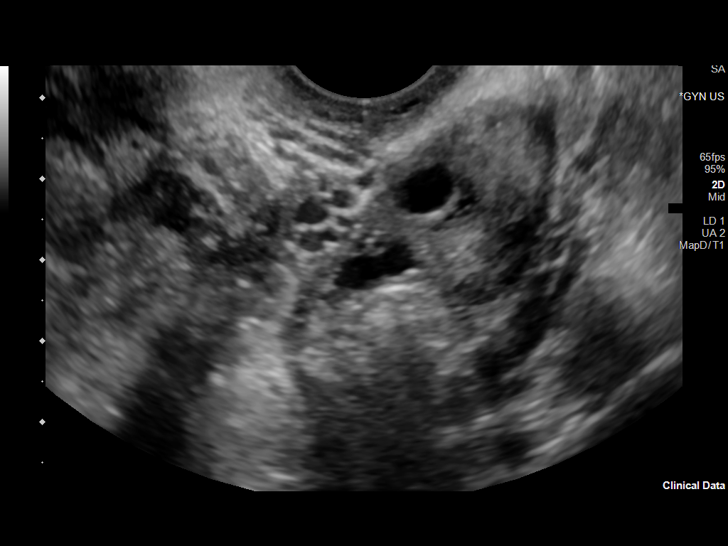
[im 111/122]
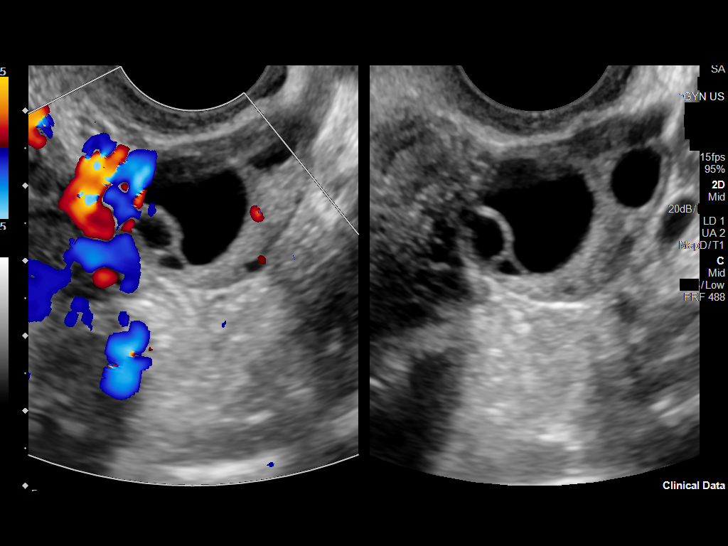
[im 122/122]
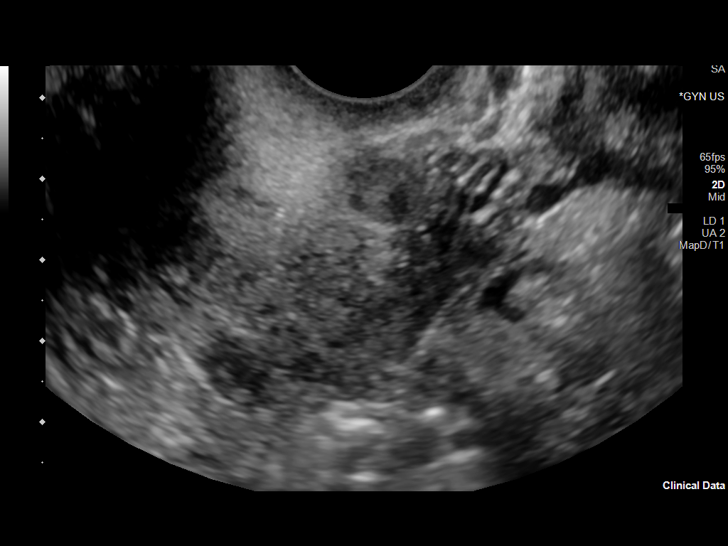

[13 of 25 positions shown; findings below may reference images not displayed]

FINDINGS: Uterus

Measurements: 6.1 by 3.7 x 4.9 cm = volume: 58 mL. Heterogenous
myometrial echotexture. Subserosal anterior lower uterine segment
fibroid measuring 16 x 10 x 13 mm. Uterine corpus posterior fibroid
measuring 16 x 11 x 14 mm.

Endometrium

Thickness: 3.6 mm.  No focal abnormality visualized.

Right ovary

Measurements: 3 x 2.4 x 2.3 cm = volume: 8.6 mL. Normal
appearance/no adnexal mass.

Left ovary

Measurements: 3.2 x 2.2 x 2.9 cm = volume: 10.3 mL. Normal
appearance/no adnexal mass.

Pulsed Doppler evaluation of both ovaries demonstrates normal
low-resistance arterial and venous waveforms.

Other findings

Small free fluid at the right adnexa
IMPRESSION: 1. Small uterine fibroids.
2. Small free fluid.

## 2023-10-06 ENCOUNTER — Telehealth: Payer: Self-pay

## 2023-10-06 NOTE — Telephone Encounter (Signed)
Pt requesting Medication refills of all the medications that you prescribed at patients last annual as patient will run out of medication prior to her next annual visit with you.    Please advise

## 2023-10-10 ENCOUNTER — Other Ambulatory Visit: Payer: Self-pay | Admitting: Obstetrics and Gynecology

## 2023-10-10 DIAGNOSIS — Z3041 Encounter for surveillance of contraceptive pills: Secondary | ICD-10-CM

## 2023-10-10 DIAGNOSIS — L700 Acne vulgaris: Secondary | ICD-10-CM

## 2023-10-10 DIAGNOSIS — J302 Other seasonal allergic rhinitis: Secondary | ICD-10-CM

## 2023-10-10 DIAGNOSIS — L709 Acne, unspecified: Secondary | ICD-10-CM

## 2023-10-10 MED ORDER — MONTELUKAST SODIUM 10 MG PO TABS: 10.0000 mg | ORAL_TABLET | Freq: Every day | ORAL | 4 refills | Status: DC

## 2023-10-10 MED ORDER — NORETHIN ACE-ETH ESTRAD-FE 1-20 MG-MCG PO TABS: 1.0000 | ORAL_TABLET | Freq: Every day | ORAL | 0 refills | Status: DC

## 2023-10-10 MED ORDER — BENZOYL PEROXIDE-ERYTHROMYCIN 5-3 % EX GEL: Freq: Two times a day (BID) | CUTANEOUS | 0 refills | Status: DC

## 2023-10-10 MED ORDER — TRETINOIN 0.025 % EX CREA: TOPICAL_CREAM | Freq: Every day | CUTANEOUS | 0 refills | Status: DC

## 2023-11-21 ENCOUNTER — Other Ambulatory Visit: Payer: Self-pay

## 2023-11-21 DIAGNOSIS — Z3041 Encounter for surveillance of contraceptive pills: Secondary | ICD-10-CM

## 2023-11-21 MED ORDER — NORETHIN ACE-ETH ESTRAD-FE 1-20 MG-MCG PO TABS: 1.0000 | ORAL_TABLET | Freq: Every day | ORAL | 0 refills | Status: DC

## 2023-11-26 ENCOUNTER — Encounter: Payer: Self-pay | Admitting: Obstetrics & Gynecology

## 2023-11-26 ENCOUNTER — Ambulatory Visit (INDEPENDENT_AMBULATORY_CARE_PROVIDER_SITE_OTHER): Payer: BC Managed Care – PPO | Admitting: Obstetrics & Gynecology

## 2023-11-26 ENCOUNTER — Other Ambulatory Visit (HOSPITAL_COMMUNITY)
Admission: RE | Admit: 2023-11-26 | Discharge: 2023-11-26 | Disposition: A | Discharge status: Home / Self Care | Payer: BC Managed Care – PPO | Source: Ambulatory Visit | Attending: Obstetrics & Gynecology | Admitting: Obstetrics & Gynecology

## 2023-11-26 VITALS — BP 132/77 | HR 72 | Ht 62.0 in | Wt 131.0 lb

## 2023-11-26 DIAGNOSIS — Z01419 Encounter for gynecological examination (general) (routine) without abnormal findings: Secondary | ICD-10-CM | POA: Insufficient documentation

## 2023-11-26 DIAGNOSIS — L700 Acne vulgaris: Secondary | ICD-10-CM | POA: Diagnosis not present

## 2023-11-26 DIAGNOSIS — L709 Acne, unspecified: Secondary | ICD-10-CM | POA: Diagnosis not present

## 2023-11-26 DIAGNOSIS — Z1339 Encounter for screening examination for other mental health and behavioral disorders: Secondary | ICD-10-CM

## 2023-11-26 DIAGNOSIS — J302 Other seasonal allergic rhinitis: Secondary | ICD-10-CM

## 2023-11-26 DIAGNOSIS — R21 Rash and other nonspecific skin eruption: Secondary | ICD-10-CM | POA: Diagnosis not present

## 2023-11-26 DIAGNOSIS — Z3041 Encounter for surveillance of contraceptive pills: Secondary | ICD-10-CM

## 2023-11-26 DIAGNOSIS — E7800 Pure hypercholesterolemia, unspecified: Secondary | ICD-10-CM

## 2023-11-26 DIAGNOSIS — R7989 Other specified abnormal findings of blood chemistry: Secondary | ICD-10-CM

## 2023-11-26 MED ORDER — NORETHIN ACE-ETH ESTRAD-FE 1-20 MG-MCG PO TABS: 1.0000 | ORAL_TABLET | Freq: Every day | ORAL | 4 refills | Status: AC

## 2023-11-26 MED ORDER — TRETINOIN 0.025 % EX CREA: TOPICAL_CREAM | Freq: Every day | CUTANEOUS | 12 refills | Status: AC

## 2023-11-26 MED ORDER — MONTELUKAST SODIUM 10 MG PO TABS: 10.0000 mg | ORAL_TABLET | Freq: Every day | ORAL | 4 refills | Status: AC

## 2023-11-26 MED ORDER — BENZOYL PEROXIDE-ERYTHROMYCIN 5-3 % EX GEL: Freq: Two times a day (BID) | CUTANEOUS | 12 refills | Status: AC

## 2023-11-26 MED ORDER — HYDROCORTISONE 2.5 % EX CREA: TOPICAL_CREAM | Freq: Two times a day (BID) | CUTANEOUS | 3 refills | Status: AC

## 2023-11-26 NOTE — Progress Notes (Signed)
 Subjective:     Sherry Sosa is a 43 y.o. female here for a routine exam.  Current complaints: No new problems. H/o HR HPV. Monogamous. No new partners.   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    Gynecologic History Patient's last menstrual period was 11/08/2023 (exact date). Contraception: OCP (estrogen/progesterone) Last Pap: 11/10/2023. Results were: normal Last mammogram: 11/06/2022. Results were: marked incomplete  Obstetric History OB History  Gravida Para Term Preterm AB Living  0 0 0 0 0 0  SAB IAB Ectopic Multiple Live Births  0 0 0 0     The following portions of the patient's history were reviewed and updated as appropriate: allergies, current medications, past family history, past medical history, past social history, past surgical history, and problem list.  Review of Systems Pertinent items are noted in HPI.    Objective:  BP 132/77   Pulse 72   Ht 5' 2 (1.575 m)   Wt 131 lb (59.4 kg)   LMP 11/08/2023 (Exact Date)   BMI 23.96 kg/m   General Appearance:    Alert, cooperative, no distress, appears stated age  Head:    Normocephalic, without obvious abnormality, atraumatic  Eyes:    conjunctiva/corneas clear, EOM's intact, both eyes  Ears:    Normal external ear canals, both ears  Nose:   Nares normal, septum midline, mucosa normal, no drainage    or sinus tenderness  Throat:   Lips, mucosa, and tongue normal; teeth and gums normal  Neck:   Supple, symmetrical, trachea midline, no adenopathy;    thyroid :  no enlargement/tenderness/nodules  Back:     Symmetric, no curvature, ROM normal, no CVA tenderness  Lungs:     respirations unlabored  Chest Wall:    No tenderness or deformity   Heart:    Regular rate and rhythm  Breast Exam:    No tenderness, masses, or nipple abnormality  Abdomen:     Soft, non-tender, bowel sounds active all four quadrants,    no masses, no organomegaly  Genitalia:    Normal female without lesion, discharge or tenderness      Extremities:   Extremities normal, atraumatic, no cyanosis or edema  Pulses:   2+ and symmetric all extremities  Skin:   Skin color, texture, turgor normal, no rashes or lesions     Assessment:    Healthy female exam.  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  Diagnoses and all orders for this visit:  Well woman exam with routine gynecological exam -     Cytology - PAP( [place]) -     Comp Met (CMET) -     Hemoglobin A1c  Acne vulgaris -     benzoyl peroxide -erythromycin  (BENZAMYCIN) gel; Apply topically 2 (two) times daily.  Acne, unspecified acne type -     benzoyl peroxide -erythromycin  (BENZAMYCIN) gel; Apply topically 2 (two) times daily. -     tretinoin  (RETIN-A ) 0.025 % cream; Apply topically at bedtime.  Rash of body -     hydrocortisone  2.5 % cream; Apply topically 2 (two) times daily.  Seasonal allergies -     montelukast  (SINGULAIR ) 10 MG tablet; Take 1 tablet (10 mg total) by mouth at bedtime.  Encounter for surveillance of contraceptive pills -     norethindrone-ethinyl estradiol-FE (LOESTRIN FE) 1-20 MG-MCG tablet; Take 1 tablet by mouth daily.  Elevated serum cholesterol -  Lipid panel  Low vitamin D  level -     Vitamin D  1,25 dihydroxy   F/u in 1 year or sooner prn   Brody Bonneau L. Harraway-Smith, M.D., FACOG

## 2023-11-27 ENCOUNTER — Other Ambulatory Visit: Payer: Self-pay | Admitting: Obstetrics & Gynecology

## 2023-11-27 ENCOUNTER — Telehealth: Payer: Self-pay

## 2023-11-27 ENCOUNTER — Other Ambulatory Visit: Payer: Self-pay

## 2023-11-27 DIAGNOSIS — R928 Other abnormal and inconclusive findings on diagnostic imaging of breast: Secondary | ICD-10-CM

## 2023-11-27 NOTE — Telephone Encounter (Signed)
 I connected with  Sherry Sosa on 11/27/23 by telephone and verified that I am speaking with the correct person using two identifiers.   Per Dr. Corene okay to give patient mammogram results and inform patient for the need of additional imaging.   Patient aware and will follow up with [facility] on Tuesday, Jan 14th at 7:45am.

## 2023-12-02 ENCOUNTER — Ambulatory Visit
Admission: RE | Admit: 2023-12-02 | Discharge: 2023-12-02 | Disposition: A | Discharge status: Home / Self Care | Payer: BC Managed Care – PPO | Source: Ambulatory Visit | Attending: Obstetrics & Gynecology | Admitting: Obstetrics & Gynecology

## 2023-12-02 ENCOUNTER — Other Ambulatory Visit (INDEPENDENT_AMBULATORY_CARE_PROVIDER_SITE_OTHER): Payer: BC Managed Care – PPO

## 2023-12-02 DIAGNOSIS — R7989 Other specified abnormal findings of blood chemistry: Secondary | ICD-10-CM | POA: Diagnosis not present

## 2023-12-02 DIAGNOSIS — R928 Other abnormal and inconclusive findings on diagnostic imaging of breast: Secondary | ICD-10-CM | POA: Diagnosis not present

## 2023-12-02 DIAGNOSIS — Z0000 Encounter for general adult medical examination without abnormal findings: Secondary | ICD-10-CM | POA: Diagnosis not present

## 2023-12-02 NOTE — Progress Notes (Signed)
Patient presents for labs. Patient was sent to the lab to have labs drawn. Hardeep Reetz l Cashus Halterman, CMA

## 2023-12-04 LAB — CYTOLOGY - PAP
Comment: NEGATIVE
Comment: NEGATIVE
Comment: NEGATIVE
Diagnosis: UNDETERMINED — AB
HPV 16: NEGATIVE
HPV 18 / 45: NEGATIVE
High risk HPV: POSITIVE — AB

## 2023-12-15 ENCOUNTER — Encounter: Payer: Self-pay | Admitting: Obstetrics & Gynecology

## 2023-12-18 LAB — COMPREHENSIVE METABOLIC PANEL
ALT: 15 [IU]/L (ref 0–32)
AST: 17 [IU]/L (ref 0–40)
Albumin: 5 g/dL — ABNORMAL HIGH (ref 3.9–4.9)
Alkaline Phosphatase: 52 [IU]/L (ref 44–121)
BUN/Creatinine Ratio: 11 (ref 9–23)
BUN: 9 mg/dL (ref 6–24)
Bilirubin Total: 0.5 mg/dL (ref 0.0–1.2)
CO2: 23 mmol/L (ref 20–29)
Calcium: 9.8 mg/dL (ref 8.7–10.2)
Chloride: 102 mmol/L (ref 96–106)
Creatinine, Ser: 0.83 mg/dL (ref 0.57–1.00)
Globulin, Total: 2.5 g/dL (ref 1.5–4.5)
Glucose: 98 mg/dL (ref 70–99)
Potassium: 4.3 mmol/L (ref 3.5–5.2)
Sodium: 142 mmol/L (ref 134–144)
Total Protein: 7.5 g/dL (ref 6.0–8.5)
eGFR: 90 mL/min/{1.73_m2} (ref >59)

## 2023-12-18 LAB — LIPID PANEL
Chol/HDL Ratio: 4.5 {ratio} — ABNORMAL HIGH (ref 0.0–4.4)
Cholesterol, Total: 282 mg/dL — ABNORMAL HIGH (ref 100–199)
HDL: 63 mg/dL (ref >39)
LDL Chol Calc (NIH): 189 mg/dL — ABNORMAL HIGH (ref 0–99)
Triglycerides: 162 mg/dL — ABNORMAL HIGH (ref 0–149)
VLDL Cholesterol Cal: 30 mg/dL (ref 5–40)

## 2023-12-18 LAB — VITAMIN D 1,25 DIHYDROXY
Vitamin D 1, 25 (OH)2 Total: 91 pg/mL — ABNORMAL HIGH
Vitamin D2 1, 25 (OH)2: <10 pg/mL
Vitamin D3 1, 25 (OH)2: 88 pg/mL

## 2023-12-18 LAB — HEMOGLOBIN A1C
Est. average glucose Bld gHb Est-mCnc: 123 mg/dL
Hgb A1c MFr Bld: 5.9 % — ABNORMAL HIGH (ref 4.8–5.6)

## 2023-12-29 ENCOUNTER — Telehealth: Payer: Self-pay

## 2023-12-29 NOTE — Telephone Encounter (Signed)
-----   Message from [facility] sent at 12/29/2023 12:29 PM EST ----- Please call pt. Her labs cont to show pre-diabetes and elevated lipids. She needs a primary care provider asI do not believe she has one. Please rec that she go to [facility] next door if she needs a recommendation. Please assist her in making an appt if she has not made one.   Thx  Clh-S

## 2023-12-29 NOTE — Telephone Encounter (Signed)
 Called patient to inform her that her labs continue to show pre Diabetes and elevated lipids. Advised patient to see her PCP. Patient states she is scheduled to see her PCP. Understanding was voiced. Sherry Sosa l [name], CMA

## 2024-01-14 ENCOUNTER — Ambulatory Visit: Payer: BC Managed Care – PPO | Admitting: Obstetrics & Gynecology

## 2024-01-14 ENCOUNTER — Encounter: Payer: Self-pay | Admitting: Physician Assistant

## 2024-01-14 ENCOUNTER — Other Ambulatory Visit (HOSPITAL_COMMUNITY)
Admission: RE | Admit: 2024-01-14 | Discharge: 2024-01-14 | Disposition: A | Discharge status: Home / Self Care | Payer: BC Managed Care – PPO | Source: Ambulatory Visit | Attending: Obstetrics & Gynecology | Admitting: Obstetrics & Gynecology

## 2024-01-14 ENCOUNTER — Ambulatory Visit: Payer: BC Managed Care – PPO | Admitting: Physician Assistant

## 2024-01-14 VITALS — BP 124/85 | HR 74 | Temp 98.2°F | Ht 62.0 in | Wt 131.4 lb

## 2024-01-14 VITALS — BP 131/82 | HR 72 | Wt 130.0 lb

## 2024-01-14 DIAGNOSIS — E559 Vitamin D deficiency, unspecified: Secondary | ICD-10-CM | POA: Insufficient documentation

## 2024-01-14 DIAGNOSIS — R7303 Prediabetes: Secondary | ICD-10-CM | POA: Insufficient documentation

## 2024-01-14 DIAGNOSIS — R87610 Atypical squamous cells of undetermined significance on cytologic smear of cervix (ASC-US): Secondary | ICD-10-CM | POA: Diagnosis not present

## 2024-01-14 DIAGNOSIS — R87810 Cervical high risk human papillomavirus (HPV) DNA test positive: Secondary | ICD-10-CM | POA: Diagnosis not present

## 2024-01-14 DIAGNOSIS — E785 Hyperlipidemia, unspecified: Secondary | ICD-10-CM | POA: Diagnosis not present

## 2024-01-14 DIAGNOSIS — N72 Inflammatory disease of cervix uteri: Secondary | ICD-10-CM | POA: Diagnosis not present

## 2024-01-14 DIAGNOSIS — Z23 Encounter for immunization: Secondary | ICD-10-CM

## 2024-01-14 DIAGNOSIS — Z8249 Family history of ischemic heart disease and other diseases of the circulatory system: Secondary | ICD-10-CM | POA: Insufficient documentation

## 2024-01-14 NOTE — Assessment & Plan Note (Signed)
 Current approach is lifestyle modifications before considering medication. Discussed dietary changes, regular exercise, and potential future medication. 10-year risk of heart attack or stroke is 1.1%. - Suggest adding a fiber supplement, such as Metamucil or Benefiber, - Continue fish oil supplement. - Reassess cholesterol levels in six months.

## 2024-01-14 NOTE — Progress Notes (Signed)
 Patient given informed consent, signed copy in the chart, time out was performed.  Placed in lithotomy position. Cervix viewed with speculum and colposcope after application of acetic acid.  11/26/2023 HIGH RISK HPV ([name]): Positive Abnormal  YPV 16 ([name]): Negative HPV 18 / 45 ([name]): Negative ADEQUACY: Satisfactory for evaluation; transformation zone component PRESENT. DIAGNOSIS: - Atypical squamous cells of undetermined significance (ASC-US) Abnormal  COMMENT (MOLECULAR): Normal Reference Range HPV - Negative COMMENT (MOLECULAR): Normal Reference Range HPV 16- Negative COMMENT (MOLECULAR): Normal Reference Range HPV 16 18 45 -Negative  Colposcopy adequate?  yes Acetowhite lesions?  no Punctation?  yes Mosaicism?   no Abnormal vasculature?  no Biopsies?  yes ECC?  yes  Patient was given post procedure instructions.  She will return in 2 weeks for results. F/u path from colpo and ECC Sherry Sosa, M.D., [place] [place]

## 2024-01-14 NOTE — Assessment & Plan Note (Addendum)
 Per pt, mother w/ open heart surgery in her early 47s. Unknown cardiac disease.  Given family history and elevated LDL, further risk stratification is warranted. Discussed calcium score CT scan to assess for coronary artery disease

## 2024-01-14 NOTE — Assessment & Plan Note (Signed)
 A1c is 5.9%, indicating prediabetes. Previous A1c was 6.0%. Lifestyle modifications for hyperlipidemia are expected to benefit glycemic control as well.

## 2024-01-14 NOTE — Assessment & Plan Note (Signed)
 Experienced nausea likely due to excessive vitamin D intake. Stopped taking vitamin D supplements for the past month, and symptoms have resolved. - Recheck vitamin D levels in six months along with cholesterol.

## 2024-01-14 NOTE — Progress Notes (Signed)
 New patient visit   Patient: Sherry Sosa   DOB: October 14, 1981   43 y.o. Female  MRN: [id number] Visit Date: 01/14/2024  Today's healthcare provider: Alfredia Ferguson, PA-C   Cc. New pt, hld  Subjective    Sherry Sosa is a 43 y.o. female who presents today as a new patient to establish care.   Discussed the use of AI scribe software for clinical note transcription with the patient, who gave verbal consent to proceed.  History of Present Illness   The patient, with a known family history of high cholesterol and heart disease, presents with concerns about her high cholesterol levels discovered during routine lab work a month ago. The patient's LDL cholesterol level was found to be 189, which is above the normal range. The patient acknowledges a genetic predisposition to high cholesterol, given her mother's history of open heart surgery and high cholesterol.   The patient has already started making some lifestyle changes since her diagnosis.  In addition to high cholesterol, the patient is prediabetic with a recent A1C of 5.9, which is a slight improvement from a previous level of 6.0. The patient has been taking vitamin D supplements for years, but recently stopped due to experiencing nausea, which she attributes to the supplement. The patient also takes a fish oil supplement and has a history of high triglycerides, which have improved over time.       Past Medical History:  Diagnosis Date   No known health problems    Past Surgical History:  Procedure Laterality Date   BARTHOLIN CYST MARSUPIALIZATION N/A 02/27/2016   Procedure: BARTHOLIN CYST RESECTION;  Surgeon: Willodean Rosenthal, MD;  Location: [facility];  Service: Gynecology;  Laterality: N/A;   Family Status  Relation Name Status   Mother Lynetta Mare Alive   Neg Hx  (Not Specified)  No partnership data on file   Family History  Problem Relation Age of Onset   Heart disease Mother        open heart surgery    Hyperlipidemia Mother    Cancer Neg Hx    Hypertension Neg Hx    Stroke Neg Hx    Social History   Socioeconomic History   Marital status: Single    Spouse name: Not on file   Number of children: Not on file   Years of education: Not on file   Highest education level: Doctorate  Occupational History   Not on file  Tobacco Use   Smoking status: Never   Smokeless tobacco: Never  Vaping Use   Vaping status: Never Used  Substance and Sexual Activity   Alcohol use: No   Drug use: No   Sexual activity: Yes    Birth control/protection: Pill  Other Topics Concern   Not on file  Social History Narrative   Not on file   Social Drivers of Health   Financial Resource Strain: Low Risk  (01/13/2024)   Overall Financial Resource Strain (CARDIA)    Difficulty of Paying Living Expenses: Not hard at all  Food Insecurity: No Food Insecurity (01/13/2024)   Hunger Vital Sign    Worried About Running Out of Food in the Last Year: Never true    Ran Out of Food in the Last Year: Never true  Transportation Needs: No Transportation Needs (01/13/2024)   PRAPARE - Administrator, Civil Service (Medical): No    Lack of Transportation (Non-Medical): No  Physical Activity: Insufficiently Active (01/13/2024)  Exercise Vital Sign    Days of Exercise per Week: 3 days    Minutes of Exercise per Session: 30 min  Stress: No Stress Concern Present (01/13/2024)   [employer] of Occupational Health - Occupational Stress Questionnaire    Feeling of Stress : Not at all  Social Connections: Socially Isolated (01/13/2024)   Social Connection and Isolation Panel [NHANES]    Frequency of Communication with Friends and Family: More than three times a week    Frequency of Social Gatherings with Friends and Family: More than three times a week    Attends Religious Services: Never    Database administrator or Organizations: No    Attends Engineer, structural: Not on file    Marital  Status: Never married   Outpatient Medications Prior to Visit  Medication Sig   benzoyl peroxide-erythromycin (BENZAMYCIN) gel Apply topically 2 (two) times daily.   fexofenadine-pseudoephedrine (ALLEGRA-D 24) 180-240 MG 24 hr tablet Take 1 tablet by mouth daily.   hydrocortisone 2.5 % cream Apply topically 2 (two) times daily.   montelukast (SINGULAIR) 10 MG tablet Take 1 tablet (10 mg total) by mouth at bedtime.   norethindrone-ethinyl estradiol-FE (LOESTRIN FE) 1-20 MG-MCG tablet Take 1 tablet by mouth daily.   ranitidine (ZANTAC) 150 MG tablet Take 150 mg by mouth daily as needed for heartburn.   tretinoin (RETIN-A) 0.025 % cream Apply topically at bedtime.   [DISCONTINUED] cetirizine (ZYRTEC) 10 MG tablet Take 10 mg by mouth daily. (Patient not taking: Reported on 05/08/2022)   [DISCONTINUED] cycloSPORINE (RESTASIS) 0.05 % ophthalmic emulsion Place 1 drop into both eyes 2 (two) times daily. (Patient not taking: Reported on 05/08/2022)   No facility-administered medications prior to visit.   Allergies  Allergen Reactions   Tuna [Fish Allergy] Anaphylaxis   Advil [Ibuprofen] Swelling   Latex Hives    Immunization History  Administered Date(s) Administered   DTP 05/18/1985, 08/18/1985, 10/18/1985, 07/20/1987, 09/19/1987   HPV 9-valent 09/16/2018, 11/09/2018, 04/22/2019   Hep B, Unspecified 05/12/1998   Influenza Inj Mdck Quad Pf 08/18/2017, 08/19/2018   Influenza, Quadrivalent, Recombinant, Inj, Pf 08/20/2019   Influenza, Seasonal, Injecte, Preservative Fre 08/19/2023   Influenza,inj,Quad PF,6+ Mos 08/16/2022   MMR 09/04/1985, 05/12/1998   OPV 05/18/1985, 08/18/1985, 10/18/1985, 07/20/1987, 09/19/1987   Tdap 01/14/2024    Health Maintenance  Topic Date Due   Pneumococcal Vaccine 27-32 Years old (1 of 2 - PCV) Never done   COVID-19 Vaccine (1 - 2024-25 season) Never done   MAMMOGRAM  12/01/2024   Cervical Cancer Screening (HPV/Pap Cotest)  11/25/2028   DTaP/Tdap/Td (6 - Td  or Tdap) 01/13/2034   INFLUENZA VACCINE  Completed   HPV VACCINES  Completed   Hepatitis C Screening  Completed   HIV Screening  Completed    Patient Care Team: Alfredia Ferguson, PA-C as PCP - General (Physician Assistant)  Review of Systems  Constitutional:  Negative for fatigue and fever.  Respiratory:  Negative for cough and shortness of breath.   Cardiovascular:  Negative for chest pain and leg swelling.  Gastrointestinal:  Negative for abdominal pain.  Neurological:  Negative for dizziness and headaches.        Objective    BP 124/85   Pulse 74   Temp 98.2 F (36.8 C) (Oral)   Ht 5\' 2"  (1.575 m)   Wt 131 lb 6 oz (59.6 kg)   LMP 01/05/2024 (Approximate)   SpO2 99%   BMI 24.03 kg/m  Physical Exam Constitutional:      General: She is awake.     Appearance: She is well-developed.  HENT:     Head: Normocephalic.  Eyes:     Conjunctiva/sclera: Conjunctivae normal.  Cardiovascular:     Rate and Rhythm: Normal rate and regular rhythm.     Heart sounds: Normal heart sounds.  Pulmonary:     Effort: Pulmonary effort is normal.     Breath sounds: Normal breath sounds.  Musculoskeletal:     Right lower leg: No edema.     Left lower leg: No edema.  Skin:    General: Skin is warm.  Neurological:     Mental Status: She is alert and oriented to person, place, and time.  Psychiatric:        Attention and Perception: Attention normal.        Mood and Affect: Mood normal.        Speech: Speech normal.        Behavior: Behavior is cooperative.     Depression Screen    01/14/2024    8:33 AM 11/26/2023    4:47 PM  PHQ 2/9 Scores  PHQ - 2 Score 0 0  PHQ- 9 Score  0   No results found for any visits on 01/14/24.  Assessment & Plan     Hyperlipidemia, unspecified hyperlipidemia type Assessment & Plan: Current approach is lifestyle modifications before considering medication. Discussed dietary changes, regular exercise, and potential future medication. 10-year  risk of heart attack or stroke is 1.1%. - Suggest adding a fiber supplement, such as Metamucil or Benefiber, - Continue fish oil supplement. - Reassess cholesterol levels in six months.  Orders: -     CT CARDIAC SCORING (SELF PAY ONLY); Future  Prediabetes Assessment & Plan: A1c is 5.9%, indicating prediabetes. Previous A1c was 6.0%. Lifestyle modifications for hyperlipidemia are expected to benefit glycemic control as well.   Family history of heart disease Assessment & Plan: Per pt, mother w/ open heart surgery in her early 22s. Unknown cardiac disease.  Given family history and elevated LDL, further risk stratification is warranted. Discussed calcium score CT scan to assess for coronary artery disease  Orders: -     CT CARDIAC SCORING (SELF PAY ONLY); Future  Vitamin D deficiency Assessment & Plan: Experienced nausea likely due to excessive vitamin D intake. Stopped taking vitamin D supplements for the past month, and symptoms have resolved. - Recheck vitamin D levels in six months along with cholesterol.   Immunization due -     Tdap vaccine greater than or equal to 7yo IM   Return in about 6 months (around 07/13/2024) for hyperlipidemia, vitamin D def.     Alfredia Ferguson, PA-C  [facility] [phone] (phone) [phone] (fax)  [facility]

## 2024-01-16 LAB — SURGICAL PATHOLOGY

## 2024-01-19 ENCOUNTER — Ambulatory Visit (HOSPITAL_BASED_OUTPATIENT_CLINIC_OR_DEPARTMENT_OTHER)
Admission: RE | Admit: 2024-01-19 | Discharge: 2024-01-19 | Disposition: A | Discharge status: Home / Self Care | Payer: Self-pay | Source: Ambulatory Visit | Attending: Physician Assistant | Admitting: Physician Assistant

## 2024-01-19 ENCOUNTER — Encounter: Payer: Self-pay | Admitting: Obstetrics & Gynecology

## 2024-01-19 DIAGNOSIS — E785 Hyperlipidemia, unspecified: Secondary | ICD-10-CM | POA: Insufficient documentation

## 2024-01-19 DIAGNOSIS — Z8249 Family history of ischemic heart disease and other diseases of the circulatory system: Secondary | ICD-10-CM | POA: Insufficient documentation

## 2024-01-21 ENCOUNTER — Encounter: Payer: Self-pay | Admitting: Physician Assistant

## 2024-01-21 ENCOUNTER — Encounter: Payer: Self-pay | Admitting: Obstetrics & Gynecology

## 2024-03-16 DIAGNOSIS — H33313 Horseshoe tear of retina without detachment, bilateral: Secondary | ICD-10-CM | POA: Diagnosis not present

## 2024-03-30 ENCOUNTER — Encounter (INDEPENDENT_AMBULATORY_CARE_PROVIDER_SITE_OTHER): Admitting: Ophthalmology

## 2024-03-30 DIAGNOSIS — H43813 Vitreous degeneration, bilateral: Secondary | ICD-10-CM

## 2024-03-30 DIAGNOSIS — H33303 Unspecified retinal break, bilateral: Secondary | ICD-10-CM | POA: Diagnosis not present

## 2024-04-27 DIAGNOSIS — H33313 Horseshoe tear of retina without detachment, bilateral: Secondary | ICD-10-CM | POA: Diagnosis not present

## 2024-04-28 DIAGNOSIS — H31093 Other chorioretinal scars, bilateral: Secondary | ICD-10-CM | POA: Diagnosis not present

## 2024-04-28 DIAGNOSIS — H5319 Other subjective visual disturbances: Secondary | ICD-10-CM | POA: Diagnosis not present

## 2024-04-28 DIAGNOSIS — H43823 Vitreomacular adhesion, bilateral: Secondary | ICD-10-CM | POA: Diagnosis not present

## 2024-06-22 ENCOUNTER — Encounter (INDEPENDENT_AMBULATORY_CARE_PROVIDER_SITE_OTHER): Admitting: Ophthalmology

## 2024-07-13 ENCOUNTER — Ambulatory Visit: Payer: BC Managed Care – PPO | Admitting: Physician Assistant

## 2024-07-13 ENCOUNTER — Encounter: Payer: Self-pay | Admitting: Physician Assistant

## 2024-07-13 ENCOUNTER — Ambulatory Visit: Payer: Self-pay | Admitting: Physician Assistant

## 2024-07-13 VITALS — BP 131/84 | HR 73 | Ht 62.0 in | Wt 131.0 lb

## 2024-07-13 DIAGNOSIS — E559 Vitamin D deficiency, unspecified: Secondary | ICD-10-CM | POA: Diagnosis not present

## 2024-07-13 DIAGNOSIS — E785 Hyperlipidemia, unspecified: Secondary | ICD-10-CM | POA: Diagnosis not present

## 2024-07-13 LAB — COMPREHENSIVE METABOLIC PANEL WITH GFR
ALT: 14 U/L (ref 0–35)
AST: 15 U/L (ref 0–37)
Albumin: 4.5 g/dL (ref 3.5–5.2)
Alkaline Phosphatase: 40 U/L (ref 39–117)
BUN: 10 mg/dL (ref 6–23)
CO2: 28 meq/L (ref 19–32)
Calcium: 9.1 mg/dL (ref 8.4–10.5)
Chloride: 101 meq/L (ref 96–112)
Creatinine, Ser: 0.72 mg/dL (ref 0.40–1.20)
GFR: 102.03 mL/min (ref >60.00)
Glucose, Bld: 94 mg/dL (ref 70–99)
Potassium: 4.3 meq/L (ref 3.5–5.1)
Sodium: 139 meq/L (ref 135–145)
Total Bilirubin: 0.4 mg/dL (ref 0.2–1.2)
Total Protein: 7.3 g/dL (ref 6.0–8.3)

## 2024-07-13 LAB — LIPID PANEL
Cholesterol: 246 mg/dL — ABNORMAL HIGH (ref 0–200)
HDL: 65.1 mg/dL (ref >39.00)
LDL Cholesterol: 145 mg/dL — ABNORMAL HIGH (ref 0–99)
NonHDL: 180.99
Total CHOL/HDL Ratio: 4
Triglycerides: 179 mg/dL — ABNORMAL HIGH (ref 0.0–149.0)
VLDL: 35.8 mg/dL (ref 0.0–40.0)

## 2024-07-13 LAB — VITAMIN D 25 HYDROXY (VIT D DEFICIENCY, FRACTURES): VITD: 40.78 ng/mL (ref 30.00–100.00)

## 2024-07-13 NOTE — Progress Notes (Signed)
 Established patient visit   Patient: Sherry Sosa   DOB: 28-Jan-1981   43 y.o. Female  MRN: [id number] Visit Date: 07/13/2024  Today's healthcare provider: Manuelita Flatness, PA-C   Cc. Hld f/u  Subjective     Lipid/Cholesterol, Follow-up  Last lipid panel Other pertinent labs  Lab Results  Component Value Date   CHOL 282 (H) 12/02/2023   HDL 63 12/02/2023   LDLCALC 189 (H) 12/02/2023   TRIG 162 (H) 12/02/2023   CHOLHDL 4.5 (H) 12/02/2023   Lab Results  Component Value Date   ALT 15 12/02/2023   AST 17 12/02/2023   PLT 368 11/05/2021   TSH 2.090 11/05/2021      The 10-year ASCVD risk score (Arnett DK, et al., 2019) is: 1.1%  ---------------------------------------------------------------------------------------------------  Pt has no acute concerns today. Reports she has only been exercising around 2 weeks.  Medications: Outpatient Medications Prior to Visit  Medication Sig   benzoyl peroxide -erythromycin  (BENZAMYCIN) gel Apply topically 2 (two) times daily.   fexofenadine-pseudoephedrine (ALLEGRA-D 24) 180-240 MG 24 hr tablet Take 1 tablet by mouth daily.   hydrocortisone  2.5 % cream Apply topically 2 (two) times daily.   montelukast  (SINGULAIR ) 10 MG tablet Take 1 tablet (10 mg total) by mouth at bedtime.   norethindrone-ethinyl estradiol-FE (LOESTRIN FE) 1-20 MG-MCG tablet Take 1 tablet by mouth daily.   tretinoin  (RETIN-A ) 0.025 % cream Apply topically at bedtime.   [DISCONTINUED] ranitidine (ZANTAC) 150 MG tablet Take 150 mg by mouth daily as needed for heartburn.   No facility-administered medications prior to visit.    Review of Systems  Constitutional:  Negative for fatigue and fever.  Respiratory:  Negative for cough and shortness of breath.   Cardiovascular:  Negative for chest pain and leg swelling.  Gastrointestinal:  Negative for abdominal pain.  Neurological:  Negative for dizziness and headaches.       Objective    BP 131/84   Pulse  73   Ht 5' 2 (1.575 m)   Wt 131 lb (59.4 kg)   LMP 06/01/2024   BMI 23.96 kg/m    Physical Exam Constitutional:      General: She is awake.     Appearance: She is well-developed.  HENT:     Head: Normocephalic.  Eyes:     Conjunctiva/sclera: Conjunctivae normal.  Cardiovascular:     Rate and Rhythm: Normal rate and regular rhythm.     Heart sounds: Normal heart sounds.  Pulmonary:     Effort: Pulmonary effort is normal.  Skin:    General: Skin is warm.  Neurological:     Mental Status: She is alert and oriented to person, place, and time.  Psychiatric:        Attention and Perception: Attention normal.        Mood and Affect: Mood normal.        Speech: Speech normal.        Behavior: Behavior is cooperative.     No results found for any visits on 07/13/24.  Assessment & Plan    Vitamin D  deficiency -     VITAMIN D  25 Hydroxy (Vit-D Deficiency, Fractures)  Hyperlipidemia, unspecified hyperlipidemia type -     Lipid panel -     Comprehensive metabolic panel with GFR  Repeat fasting lipids, vit D   Return in about 6 months (around 01/13/2025) for CPE.       Manuelita Flatness, PA-C   [facility]  at [facility] [phone] (phone) [phone] (fax)  [facility]

## 2024-10-18 ENCOUNTER — Other Ambulatory Visit: Payer: Self-pay | Admitting: Obstetrics & Gynecology

## 2024-10-18 DIAGNOSIS — N6489 Other specified disorders of breast: Secondary | ICD-10-CM

## 2024-12-07 ENCOUNTER — Ambulatory Visit
Admission: RE | Admit: 2024-12-07 | Discharge: 2024-12-07 | Disposition: A | Source: Ambulatory Visit | Attending: Obstetrics and Gynecology | Admitting: Obstetrics and Gynecology

## 2024-12-07 DIAGNOSIS — N6489 Other specified disorders of breast: Secondary | ICD-10-CM

## 2024-12-08 ENCOUNTER — Ambulatory Visit: Payer: Self-pay | Admitting: Obstetrics & Gynecology
